# Patient Record
Sex: Female | Born: 2000 | Race: Black or African American | Hispanic: No | Marital: Single | Smoking: Former smoker
Health system: Southern US, Community
[De-identification: ages and names within clinical notes are randomized; demographics above are authoritative.]

## PROBLEM LIST (undated history)

## (undated) ENCOUNTER — Inpatient Hospital Stay (HOSPITAL_COMMUNITY): Payer: Self-pay

## (undated) HISTORY — PX: NO PAST SURGERIES: SHX2092

---
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
## 2022-09-11 ENCOUNTER — Ambulatory Visit: Payer: Medicaid Other

## 2023-02-14 NOTE — L&D Delivery Note (Signed)
 OB/GYN Faculty Practice Delivery Note  Norma Deleon is a 23 y.o. G1P0000 s/p SVD at [redacted]w[redacted]d. She was admitted for IOL due to gHTN.   ROM: 24h 91m with light meconium stained fluid GBS Status: Negative/-- (07/23 1148) Maximum Maternal Temperature: 40F   Labor Progress: Initial SVE: 0/50/ballotable. She then progressed to complete.   Delivery Date/Time: 10/01/23 1041 Delivery: Called to room and patient was complete and pushing. Head delivered ROA. No nuchal cord present. Shoulder and body delivered in usual fashion. Infant with spontaneous cry, placed on mother's abdomen, dried and stimulated. Cord clamped x 2 after 1-minute delay, and cut by father of baby, Damari. Cord blood drawn. Placenta delivered spontaneously with gentle cord traction. Fundus firm with massage and Pitocin . Labia, perineum, vagina, and cervix inspected inspected with extensive bilateral sulcal tears, a small second degree tear, and left labial laceration noted. These tears were all repaired in the usual fashion with adequate hemostasis. After repair, an in-and-out catheterization of the bladder was performed with 200cc drained. After repair, postpartum Pitocin , TXA, and straight catheterization, adequate hemostasis noted.  Baby Weight: pending  Placenta: Sent to L&D Complications: None Lacerations: Bilateral deep sulcal tears, second degree perineal laceration, left labial laceration -- all repaired EBL: 942 mL Analgesia: Epidural   Infant:  APGAR (1 MIN): 8  APGAR (5 MINS): 9   Alain Sor, MD OB Fellow, Faculty Practice [facility], Center for [facility]

## 2023-02-22 ENCOUNTER — Encounter: Payer: Self-pay | Admitting: Advanced Practice Midwife

## 2023-02-22 ENCOUNTER — Ambulatory Visit (INDEPENDENT_AMBULATORY_CARE_PROVIDER_SITE_OTHER): Payer: Medicaid Other | Admitting: Advanced Practice Midwife

## 2023-02-22 VITALS — BP 134/75 | HR 66 | Wt 161.4 lb

## 2023-02-22 DIAGNOSIS — Z3490 Encounter for supervision of normal pregnancy, unspecified, unspecified trimester: Secondary | ICD-10-CM

## 2023-02-22 DIAGNOSIS — N912 Amenorrhea, unspecified: Secondary | ICD-10-CM | POA: Diagnosis not present

## 2023-02-22 DIAGNOSIS — Z3201 Encounter for pregnancy test, result positive: Secondary | ICD-10-CM | POA: Diagnosis not present

## 2023-02-22 DIAGNOSIS — N926 Irregular menstruation, unspecified: Secondary | ICD-10-CM

## 2023-02-22 LAB — POCT URINE PREGNANCY: Preg Test, Ur: POSITIVE — AB

## 2023-02-22 NOTE — Progress Notes (Signed)
   Patient ID: Norma Deleon Minor, female   DOB: 2000-12-20, 23 y.o.   MRN: [id number]  Reason for Visit: pregnancy confirmation   Subjective:  HPI:  Norma Deleon is a 23 y.o. female being seen for pregnancy confirmation visit. She is 8 weeks 2 days by LMP of 12/26/22. She reports some nausea that has been improving. 2 positive pregnancy tests at home. Pregnancy not planned or desired, however, she plans to keep. She is informed of positive in office test today. Reviewed upcoming visits including labs/ultrasound.  History reviewed. No pertinent past medical history. Family History  Problem Relation Age of Onset   Healthy Paternal Grandfather    Healthy Paternal Grandmother    Healthy Maternal Grandmother    Diabetes Maternal Grandfather    Kidney disease Maternal Grandfather    Healthy Father    Diabetes Mother    Heart disease Mother    Past Surgical History:  Procedure Laterality Date   NO PAST SURGERIES      Short Social History:  Social History   Tobacco Use   Smoking status: Former    Types: E-cigarettes, Cigars   Smokeless tobacco: Never  Substance Use Topics   Alcohol use: Yes    Alcohol/week: 5.0 standard drinks of alcohol    Types: 5 Shots of liquor per week    Comment: occassionally    No Known Allergies  No current outpatient medications on file.   No current facility-administered medications for this visit.    Review of Systems  Constitutional:  Negative for chills and fever.  HENT:  Negative for congestion, ear discharge, ear pain, hearing loss, sinus pain and sore throat.   Eyes:  Negative for blurred vision and double vision.  Respiratory:  Negative for cough, shortness of breath and wheezing.   Cardiovascular:  Negative for chest pain, palpitations and leg swelling.  Gastrointestinal:  Positive for nausea. Negative for abdominal pain, blood in stool, constipation, diarrhea, heartburn, melena and vomiting.  Genitourinary:  Negative for dysuria, flank  pain, frequency, hematuria and urgency.  Musculoskeletal:  Negative for back pain, joint pain and myalgias.  Skin:  Negative for itching and rash.  Neurological:  Negative for dizziness, tingling, tremors, sensory change, speech change, focal weakness, seizures, loss of consciousness, weakness and headaches.  Endo/Heme/Allergies:  Negative for environmental allergies. Does not bruise/bleed easily.  Psychiatric/Behavioral:  Negative for depression, hallucinations, memory loss, substance abuse and suicidal ideas. The patient is not nervous/anxious and does not have insomnia.         Objective:  Objective   Vitals:   02/22/23 0918  BP: 134/75  Pulse: 66  Weight: 161 lb 6.4 oz (73.2 kg)   Body mass index is 27.7 kg/m. Constitutional: Well nourished, well developed female in no acute distress.  HEENT: normal Skin: Warm and dry.   Extremity:  no edema   Respiratory:  Normal respiratory effort Psych: Alert and Oriented x3. No memory deficits. Normal mood and affect.    Data:  Latest Reference Range & Units 02/22/23 09:46  Preg Test, Ur Negative  Positive !  !: Data is abnormal     Assessment/Plan:     23 year old with positive pregnancy test  Schedule NOB visits including ultrasound/labs   Slater Rains, CNM [facility] 02/22/2023 10:04 AM

## 2023-03-01 ENCOUNTER — Ambulatory Visit: Payer: Medicaid Other

## 2023-03-01 ENCOUNTER — Other Ambulatory Visit (HOSPITAL_COMMUNITY)
Admission: RE | Admit: 2023-03-01 | Discharge: 2023-03-01 | Disposition: A | Discharge status: Home / Self Care | Payer: Medicaid Other | Source: Ambulatory Visit | Attending: Obstetrics and Gynecology | Admitting: Obstetrics and Gynecology

## 2023-03-01 VITALS — BP 119/80 | HR 93 | Ht 65.0 in | Wt 161.3 lb

## 2023-03-01 DIAGNOSIS — Z113 Encounter for screening for infections with a predominantly sexual mode of transmission: Secondary | ICD-10-CM | POA: Insufficient documentation

## 2023-03-01 DIAGNOSIS — N898 Other specified noninflammatory disorders of vagina: Secondary | ICD-10-CM | POA: Diagnosis present

## 2023-03-01 NOTE — Progress Notes (Signed)
    NURSE VISIT NOTE  Subjective:    Patient ID: Norma Deleon, female    DOB: Jan 25, 2001, 23 y.o.   MRN: [id number]  HPI  Patient is a 23 y.o. G88P0000 female who presents for brown malodorous vaginal discharge for 2 week(s). Denies abnormal vaginal bleeding or significant pelvic pain or fever. denies UTI symptoms. Patient denies history of known exposure to STD.   Objective:    BP 119/80   Pulse 93   Ht 5\' 5"  (1.651 m)   Wt 161 lb 4.8 oz (73.2 kg)   LMP 12/26/2022   BMI 26.84 kg/m      Assessment:   1. Vaginal discharge   2. Screening for STD (sexually transmitted disease)   3. Vaginal odor       Plan:   GC and chlamydia DNA  probe sent to lab. Treatment: await results for further treatment ROV prn if symptoms persist or worsen.   Loman Chroman, CMA

## 2023-03-01 NOTE — Patient Instructions (Signed)
Bacterial Vaginosis  Bacterial vaginosis is an infection of the vagina. It happens when too many normal germs (healthy bacteria) grow in the vagina. This infection can make it easier to get other infections from sex (STIs). It is very important for pregnant women to get treated. This infection can cause babies to be born early or at a low birth weight. What are the causes? This infection is caused by an increase in certain germs that grow in the vagina. You cannot get this infection from toilet seats, bedsheets, swimming pools, or things that touch your vagina. What increases the risk? Having sex with a new person or more than one person. Having sex without protection. Douching. Having an intrauterine device (IUD). Smoking. Using drugs or drinking alcohol. These can lead you to do things that are risky. Taking certain antibiotic medicines. Being pregnant. What are the signs or symptoms? Some women have no symptoms. Symptoms may include: A discharge from your vagina. It may be gray or white. It can be watery or foamy. A fishy smell. This can happen after sex or during your menstrual period. Itching in and around your vagina. A feeling of burning or pain when you pee (urinate). How is this treated? This infection is treated with antibiotic medicines. These may be given to you as: A pill. A cream for your vagina. A medicine that you put into your vagina (suppository). If the infection comes back after treatment, you may need more antibiotics. Follow these instructions at home: Medicines Take over-the-counter and prescription medicines as told by your doctor. Take or use your antibiotic medicine as told by your doctor. Do not stop taking or using it, even if you start to feel better. General instructions If the person you have sex with is a woman, tell her that you have this infection. She will need to follow up with her doctor. If you have a female partner, he does not need to be  treated. Do not have sex until you finish treatment. Drink enough fluid to keep your pee pale yellow. Keep your vagina and butt clean. Wash the area with warm water each day. Wipe from front to back after you use the toilet. If you are breastfeeding a baby, ask your doctor if you should keep doing so during treatment. Keep all follow-up visits. How is this prevented? Self-care Do not douche. Use only warm water to wash around your vagina. Wear underwear that is Signer or lined with Agar. Do not wear tight pants and pantyhose, especially in the summer. Safe sex Use protection when you have sex. This includes: Use condoms. Use dental dams. This is a thin layer that protects the mouth during oral sex. Limit how many people you have sex with. To prevent this infection, it is best to have sex with just one person. Get tested for STIs. The person you have sex with should also get tested. Drugs and alcohol Do not smoke or use any products that contain nicotine or tobacco. If you need help quitting, ask your doctor. Do not use drugs. Do not drink alcohol if: Your doctor tells you not to drink. You are pregnant, may be pregnant, or are planning to become pregnant. If you drink alcohol: Limit how much you have to 0-1 drink a day. Know how much alcohol is in your drink. In the [place] one drink equals one 12 oz bottle of beer (355 mL), one 5 oz glass of wine (148 mL), or one 1 oz glass of hard liquor (44  mL). Where to find more information Centers for Disease Control and Prevention: FootballExhibition.com.br American Sexual Health Association: www.ashastd.org Office on [employer] Health: http://hoffman.com/ Contact a doctor if: Your symptoms do not get better, even after you are treated. You have more discharge or pain when you pee. You have a fever or chills. You have pain in your belly (abdomen) or in the area between your hips. You have pain with sex. You bleed from your vagina between menstrual  periods. Summary This infection can happen when too many germs (bacteria) grow in the vagina. This infection can make it easier to get infections from sex (STIs). Treating this can lower that chance. Get treated if you are pregnant. This infection can cause babies to be born early. Do not stop taking or using your antibiotic medicine, even if you start to feel better. This information is not intended to replace advice given to you by your health care provider. Make sure you discuss any questions you have with your health care provider. Document Revised: 07/31/2019 Document Reviewed: 07/31/2019 Elsevier Patient Education  2024 [employer] Vaginitis  Vaginitis is a condition in which the vaginal tissue swells and becomes irritated. This condition is most often caused by a change in the normal balance of bacteria and yeast that live in the vagina. This change causes an overgrowth of certain bacteria or yeast, which causes the inflammation. There are different types of vaginitis. What are the causes? The cause of this condition depends on the type of vaginitis. It can be caused by: Bacteria (bacterial vaginosis). Yeast, which is a fungus (candidiasis). A parasite (trichomoniasis vaginitis). A virus (viral vaginitis). Low hormone levels (atrophic vaginitis). Low hormone levels can occur during pregnancy, breastfeeding, or after menopause. Irritants, such as bubble baths, scented tampons, and feminine sprays (allergic vaginitis). Other factors can change the normal balance of the yeast and bacteria that live in the vagina. These include: Antibiotic medicines. Poor hygiene. Diaphragms, vaginal sponges, spermicides, birth control pills, and intrauterine devices (IUDs). Sex. Infection. Uncontrolled diabetes. A weakened body defense system (immune system). What increases the risk? This condition is more likely to develop in women who: Smoke or are exposed to secondhand smoke. Use vaginal  douches, scented tampons, or scented sanitary pads. Wear tight-fitting pants or thong underwear. Use oral birth control pills or an IUD. Have sex without a condom or have multiple partners. Have an STI. Frequently use the spermicide nonoxynol-9. Eat lots of foods high in sugar or who have uncontrolled diabetes. Have low estrogen levels. Have a weakened immune system from an immune disorder or medical treatment. Are pregnant or breastfeeding. What are the signs or symptoms? Symptoms vary depending on the cause of the vaginitis. Common symptoms include: Abnormal vaginal discharge. The discharge is white, gray, or yellow with bacterial vaginosis. The discharge is thick, white, and cheesy with a yeast infection. The discharge is frothy and yellow or greenish with trichomoniasis. A bad vaginal smell. The smell is fishy with bacterial vaginosis. Vaginal itching, pain, or swelling. Pain with sex. Pain or burning when urinating. Sometimes there are no symptoms. How is this diagnosed? This condition is diagnosed based on your symptoms and medical history. A physical exam, including a pelvic exam, will also be done. You may also have other tests, including: Tests to determine the pH level (acidity or alkalinity) of your vagina. A whiff test to assess the odor that results when a sample of your vaginal discharge is mixed with a potassium hydroxide solution. Tests of vaginal fluid.  A sample will be examined under a microscope. How is this treated? Treatment varies depending on the type of vaginitis you have. Your treatment may include: Antibiotic creams or pills to treat bacterial vaginosis and trichomoniasis. Antifungal medicines, such as vaginal creams or suppositories, to treat a yeast infection. Medicine to ease discomfort if you have viral vaginitis. Your sexual partner should also be treated. Estrogen delivered in a cream, pill, suppository, or vaginal ring to treat atrophic vaginitis. If  vaginal dryness occurs, lubricants and moisturizing creams may help. You may need to avoid scented soaps, sprays, or douches. Stopping use of a product that is causing allergic vaginitis and then using a vaginal cream to treat the symptoms. Follow these instructions at home: Lifestyle Keep your genital area clean and dry. Avoid soap, and only rinse the area with water. Do not douche or use tampons until your health care provider says it is okay. Use sanitary pads, if needed. Do not have sex until your health care provider approves. When you can return to sex, practice safe sex and use condoms. Wipe from front to back. This avoids the spread of bacteria from the rectum to the vagina. General instructions Take over-the-counter and prescription medicines only as told by your health care provider. If you were prescribed an antibiotic medicine, take or use it as told by your health care provider. Do not stop taking or using the antibiotic even if you start to feel better. Keep all follow-up visits. This is important. How is this prevented? Use mild, unscented products. Do not use things that can irritate the vagina, such as fabric softeners. Avoid the following products if they are scented: Feminine sprays. Detergents. Tampons. Feminine hygiene products. Soaps or bubble baths. Let air reach your genital area. To do this: Wear [place] underwear to reduce moisture buildup. Avoid wearing underwear while you sleep. Avoid wearing tight pants and underwear or nylons without a [place]le panel. Avoid wearing thong underwear. Take off any wet clothing, such as bathing suits, as soon as possible. Practice safe sex and use condoms. Contact a health care provider if: You have abdominal or pelvic pain. You have a fever or chills. You have symptoms that last for more than 2-3 days. Get help right away if: You have a fever and your symptoms suddenly get worse. Summary Vaginitis is a condition in which the  vaginal tissue becomes inflamed.This condition is most often caused by a change in the normal balance of bacteria and yeast that live in the vagina. Treatment varies depending on the type of vaginitis you have. Do not douche, use tampons, or have sex until your health care provider approves. When you can return to sex, practice safe sex and use condoms. This information is not intended to replace advice given to you by your health care provider. Make sure you discuss any questions you have with your health care provider. Document Revised: 07/31/2019 Document Reviewed: 07/31/2019 Elsevier Patient Education  2024 [employer].

## 2023-03-02 ENCOUNTER — Telehealth: Payer: Self-pay

## 2023-03-02 ENCOUNTER — Other Ambulatory Visit: Payer: Self-pay

## 2023-03-02 DIAGNOSIS — B9689 Other specified bacterial agents as the cause of diseases classified elsewhere: Secondary | ICD-10-CM

## 2023-03-02 LAB — CERVICOVAGINAL ANCILLARY ONLY
Bacterial Vaginitis (gardnerella): POSITIVE — AB
Candida Glabrata: NEGATIVE
Candida Vaginitis: NEGATIVE
Chlamydia: NEGATIVE
Comment: NEGATIVE
Comment: NEGATIVE
Comment: NEGATIVE
Comment: NEGATIVE
Comment: NEGATIVE
Comment: NORMAL
Neisseria Gonorrhea: NEGATIVE
Trichomonas: NEGATIVE

## 2023-03-02 MED ORDER — METRONIDAZOLE 500 MG PO TABS: 500.0000 mg | ORAL_TABLET | Freq: Two times a day (BID) | ORAL | 0 refills | Status: DC

## 2023-03-02 NOTE — Telephone Encounter (Signed)
Pt called triage, she got her results back and was positive for BV, Medication sent in to pharmacy. Pt aware

## 2023-03-05 ENCOUNTER — Telehealth: Payer: Medicaid Other

## 2023-03-05 DIAGNOSIS — Z3490 Encounter for supervision of normal pregnancy, unspecified, unspecified trimester: Secondary | ICD-10-CM | POA: Insufficient documentation

## 2023-03-05 DIAGNOSIS — Z3689 Encounter for other specified antenatal screening: Secondary | ICD-10-CM | POA: Diagnosis not present

## 2023-03-05 DIAGNOSIS — Z3401 Encounter for supervision of normal first pregnancy, first trimester: Secondary | ICD-10-CM | POA: Insufficient documentation

## 2023-03-05 NOTE — Patient Instructions (Signed)
First Trimester of Pregnancy  The first trimester of pregnancy starts on the first day of your last monthly period until the end of week 13. This is months 1 through 3 of pregnancy. A week after a sperm fertilizes an egg, the egg will implant into the wall of the uterus and begin to develop into a baby. Body changes during your first trimester Your body goes through many changes during pregnancy. The changes usually return to normal after your baby is born. Physical changes Your breasts may grow larger and may hurt. The area around your nipples may get darker. Your periods will stop. Your hair and nails may grow faster. You may pee more often. Health changes You may tire easily. Your gums may bleed and may be sensitive when you brush and floss. You may not feel hungry. You may have heartburn. You may throw up or feel like you may throw up. You may want to eat some foods, but not others. You may have headaches. You may have trouble pooping (constipation). Other changes Your emotions may change from day to day. You may have more dreams. Follow these instructions at home: Medicines Talk to your health care provider if you're taking medicines. Ask if the medicines are safe to take during pregnancy. Your provider may change the medicines that you take. Do not take any medicines unless told to by your provider. Take a prenatal vitamin that has at least 600 micrograms (mcg) of folic acid. Do not use herbal medicines, illegal substances, or medicines that are not approved by your provider. Eating and drinking While you're pregnant your body needs extra food for your growing baby. Talk with your provider about what to eat while pregnant. Activity Most women are able to exercise during pregnancy. Exercises may need to change as your pregnancy goes on. Talk to your provider about your activities and exercise routines. Relieving pain and discomfort Wear a good, supportive bra if your breasts  hurt. Rest with your legs raised if you have leg cramps or low back pain. Safety Wear your seatbelt at all times when you're in a car. Talk to your provider if someone hits you, hurts you, or yells at you. Talk with your provider if you're feeling sad or have thoughts of hurting yourself. Lifestyle Certain things can be harmful while you're pregnant. Follow these rules: Do not use hot tubs, steam rooms, or saunas. Do not douche. Do not use tampons or scented pads. Do not drink alcohol,smoke, vape, or use products with nicotine or tobacco in them. If you need help quitting, talk with your provider. Avoid cat litter boxes and soil used by cats. These things carry germs that can cause harm to your pregnancy and your baby. General instructions Keep all follow-up visits. It helps you and your unborn baby stay as healthy as possible. Write down your questions. Take them to your visits. Your provider will: Talk with you about your overall health. Give you advice or refer you to specialists who can help with different needs, including: Prenatal education classes. Mental health and counseling. Foods and healthy eating. Ask for help if you need help with food. Call your dentist and ask to be seen. Brush your teeth with a soft toothbrush. Floss gently. Where to find more information American Pregnancy Association: americanpregnancy.org [employer]: acog.org Office on [employer] Health: [employer] Contact a health care provider if: You feel dizzy, faint, or have a fever. You vomit or have watery poop (diarrhea) for 2  days or more. You have abnormal discharge or bleeding from your vagina. You have pain when you pee or your pee smells bad. You have cramps, pain, or pressure in your belly area. Get help right away if: You have trouble breathing or chest pain. You have any kind of injury, such as from a fall or a car crash. These symptoms may be an  emergency. Get help right away. Call 911. Do not wait to see if the symptoms will go away. Do not drive yourself to the hospital. This information is not intended to replace advice given to you by your health care provider. Make sure you discuss any questions you have with your health care provider. Document Revised: 11/02/2022 Document Reviewed: 06/02/2022 Elsevier Patient Education  2024 Elsevier Inc.  Morning Sickness Morning sickness is when you throw up or feel like you may throw up during pregnancy. This condition often occurs in the morning, but it can also occur at any time of day. Morning sickness is most common during the first three months of pregnancy, but it can go on throughout the pregnancy. Morning sickness is usually harmless. But if you throw up all the time, you should see your health care provider. You may also hear this condition called nausea and vomiting of pregnancy. What are the causes? The cause of morning sickness is not known. It may be linked to changes in hormones during pregnancy. What increases the risk? You're more likely to have morning sickness if: You had morning sickness in another pregnancy. You're pregnant with more than one baby, such as twins. You had morning sickness in other pregnancies. You have had motion sickness before you were pregnant. You have had bad headaches or migraines before you were pregnant. What are the signs or symptoms? Symptoms of morning sickness include: Feeling like you may throw up. Throwing up. How is this diagnosed? Morning sickness is diagnosed based on your symptoms. How is this treated? Treatment is usually not needed for morning sickness. You may only need to change what you eat. In some cases, your provider may give you: Vitamin B6 supplements. Medicines to prevent throwing up. Ginger. Follow these instructions at home: Medicines Take your medicines only as told by your provider. Do not use any prescription,  over-the-counter, or herbal medicines for morning sickness without first talking with your provider. Take prenatal vitamins. These can stop or lessen the symptoms of morning sickness. If you feel like you may throw up after taking prenatal vitamins, take them at night or with a snack. Eating and drinking     Eat dry toast or crackers before getting out of bed. Eat 5 or 6 small meals a day. Try ginger ale made with real ginger, ginger tea, or ginger candies. Drink fluids throughout the day. Eat protein foods when you need a snack. Nuts, yogurt, and cheese are good choices. Eat dry and bland foods like rice or baked potatoes. Foods that are high in carbohydrates are often helpful. Have someone cook for you if the smell of food makes you want to throw up. Foods to avoid Greasy foods. Fatty foods. Spicy foods. General instructions Try to avoid smells that make you feel sick. Use an air purifier to keep the air in your house free of smells. Try using an acupressure wristband. This is a wristband that's used to treat motion sickness. Try acupuncture. In this treatment, a provider puts thin needles into certain areas of your body to make you feel better. Brush your teeth  after throwing up or rinse with a mix of baking soda and water. The acid in throw-up can hurt your teeth. Contact a health care provider if: Your symptoms do not get better. You feel dizzy or light-headed. You're losing weight. Get help right away if: The feeling that you may throw up will not go away, or you can't stop throwing up. You faint. You have very bad pain in your belly. This information is not intended to replace advice given to you by your health care provider. Make sure you discuss any questions you have with your health care provider. Document Revised: 11/02/2022 Document Reviewed: 05/11/2022 Elsevier Patient Education  2024 Elsevier Inc.  Commonly Asked Questions During Pregnancy  Cats: A parasite can  be excreted in cat feces.  To avoid exposure you need to have another person empty the little box.  If you must empty the litter box you will need to wear gloves.  Wash your hands after handling your cat.  This parasite can also be found in raw or undercooked meat so this should also be avoided.  Colds, Sore Throats, Flu: Please check your medication sheet to see what you can take for symptoms.  If your symptoms are unrelieved by these medications please call the office.  Dental Work: Most any dental work Agricultural consultant recommends is permitted.  X-rays should only be taken during the first trimester if absolutely necessary.  Your abdomen should be shielded with a lead apron during all x-rays.  Please notify your provider prior to receiving any x-rays.  Novocaine is fine; gas is not recommended.  If your dentist requires a note from [place] prior to dental work please call the office and we will provide one for you.  Exercise: Exercise is an important part of staying healthy during your pregnancy.  You may continue most exercises you were accustomed to prior to pregnancy.  Later in your pregnancy you will most likely notice you have difficulty with activities requiring balance like riding a bicycle.  It is important that you listen to your body and avoid activities that put you at a higher risk of falling.  Adequate rest and staying well hydrated are a must!  If you have questions about the safety of specific activities ask your provider.    Exposure to Children with illness: Try to avoid obvious exposure; report any symptoms to [place] when noted,  If you have chicken pos, red measles or mumps, you should be immune to these diseases.   Please do not take any vaccines while pregnant unless you have checked with your OB provider.  Fetal Movement: After 28 weeks we recommend you do "kick counts" twice daily.  Lie or sit down in a calm quiet environment and count your baby movements "kicks".  You should feel your baby at  least 10 times per hour.  If you have not felt 10 kicks within the first hour get up, walk around and have something sweet to eat or drink then repeat for an additional hour.  If count remains less than 10 per hour notify your provider.  Fumigating: Follow your pest control agent's advice as to how long to stay out of your home.  Ventilate the area well before re-entering.  Hemorrhoids:   Most over-the-counter preparations can be used during pregnancy.  Check your medication to see what is safe to use.  It is important to use a stool softener or fiber in your diet and to drink lots of liquids.  If  hemorrhoids seem to be getting worse please call the office.   Hot Tubs:  Hot tubs Jacuzzis and saunas are not recommended while pregnant.  These increase your internal body temperature and should be avoided.  Intercourse:  Sexual intercourse is safe during pregnancy as long as you are comfortable, unless otherwise advised by your provider.  Spotting may occur after intercourse; report any bright red bleeding that is heavier than spotting.  Labor:  If you know that you are in labor, please go to the hospital.  If you are unsure, please call the office and let us help you decide what to do.  Lifting, straining, etc:  If your job requires heavy lifting or straining please check with your provider for any limitations.  Generally, you should not lift items heavier than that you can lift simply with your hands and arms (no back muscles)  Painting:  Paint fumes do not harm your pregnancy, but may make you ill and should be avoided if possible.  Latex or water based paints have less odor than oils.  Use adequate ventilation while painting.  Permanents & Hair Color:  Chemicals in hair dyes are not recommended as they cause increase hair dryness which can increase hair loss during pregnancy.  " Highlighting" and permanents are allowed.  Dye may be absorbed differently and permanents may not hold as well during  pregnancy.  Sunbathing:  Use a sunscreen, as skin burns easily during pregnancy.  Drink plenty of fluids; avoid over heating.  Tanning Beds:  Because their possible side effects are still unknown, tanning beds are not recommended.  Ultrasound Scans:  Routine ultrasounds are performed at approximately 20 weeks.  You will be able to see your baby's general anatomy an if you would like to know the gender this can usually be determined as well.  If it is questionable when you conceived you may also receive an ultrasound early in your pregnancy for dating purposes.  Otherwise ultrasound exams are not routinely performed unless there is a medical necessity.  Although you can request a scan we ask that you pay for it when conducted because insurance does not cover " patient request" scans.  Work: If your pregnancy proceeds without complications you may work until your due date, unless your physician or employer advises otherwise.  Round Ligament Pain/Pelvic Discomfort:  Sharp, shooting pains not associated with bleeding are fairly common, usually occurring in the second trimester of pregnancy.  They tend to be worse when standing up or when you remain standing for long periods of time.  These are the result of pressure of certain pelvic ligaments called "round ligaments".  Rest, Tylenol and heat seem to be the most effective relief.  As the womb and fetus grow, they rise out of the pelvis and the discomfort improves.  Please notify the office if your pain seems different than that described.  It may represent a more serious condition.   Common Medications Safe in Pregnancy  Acne:      Constipation:  Benzoyl Peroxide     Colace  Clindamycin      Dulcolax Suppository  Topica Erythromycin     Fibercon  Salicylic Acid      Metamucil         Miralax AVOID:        Senakot   Accutane    Cough:  Retin-A       Cough Drops  Tetracycline      Phenergan w/ Codeine if Rx  Minocycline  Robitussin (Plain &  DM)  Antibiotics:     Crabs/Lice:  Ceclor       RID  Cephalosporins    AVOID:  E-Mycins      Kwell  Keflex  Macrobid/Macrodantin   Diarrhea:  Penicillin      Kao-Pectate  Zithromax      Imodium AD         PUSH FLUIDS AVOID:       Cipro     Fever:  Tetracycline      Tylenol (Regular or Extra  Minocycline       Strength)  Levaquin      Extra Strength-Do not          Exceed 8 tabs/24 hrs Caffeine:        200mg /day (equiv. To 1 cup of coffee or  approx. 3 12 oz sodas)         Gas: Cold/Hayfever:       Gas-X  Benadryl      Mylicon  Claritin       Phazyme  **Claritin-D        Chlor-Trimeton    Headaches:  Dimetapp      ASA-Free Excedrin  Drixoral-Non-Drowsy     Cold Compress  Mucinex (Guaifenasin)     Tylenol (Regular or Extra  Sudafed/Sudafed-12 Hour     Strength)  **Sudafed PE Pseudoephedrine   Tylenol Cold & Sinus     Vicks Vapor Rub  Zyrtec  **AVOID if Problems With Blood Pressure         Heartburn: Avoid lying down for at least 1 hour after meals  Aciphex      Maalox     Rash:  Milk of Magnesia     Benadryl    Mylanta       1% Hydrocortisone Cream  Pepcid  Pepcid Complete   Sleep Aids:  Prevacid      Ambien   Prilosec       Benadryl  Rolaids       Chamomile Tea  Tums (Limit 4/day)     Unisom         Tylenol PM         Warm milk-add vanilla or  Hemorrhoids:       Sugar for taste  Anusol/Anusol H.C.  (RX: Analapram 2.5%)  Sugar Substitutes:  Hydrocortisone OTC     Ok in moderation  Preparation H      Tucks        Vaseline lotion applied to tissue with wiping    Herpes:     Throat:  Acyclovir      Oragel  Famvir  Valtrex     Vaccines:         Flu Shot Leg Cramps:       *Gardasil  Benadryl      Hepatitis A         Hepatitis B Nasal Spray:       Pneumovax  Saline Nasal Spray     Polio Booster         Tetanus Nausea:       Tuberculosis test or PPD  Vitamin B6 25 mg TID   AVOID:    Dramamine      *Gardasil  Emetrol       Live Poliovirus  Ginger  Root 250 mg QID    MMR (measles, mumps &  High Complex Carbs @ Bedtime    rebella)  Sea Bands-Accupressure    Varicella (Chickenpox)  Unisom 1/2  tab TID     *No known complications           If received before Pain:         Known pregnancy;   Darvocet       Resume series after  Lortab        Delivery  Percocet    Yeast:   Tramadol      Femstat  Tylenol 3      Gyne-lotrimin  Ultram       Monistat  Vicodin           MISC:         All Sunscreens           Hair Coloring/highlights          Insect Repellant's          (Including DEET)         Mystic Tans

## 2023-03-05 NOTE — Progress Notes (Cosign Needed Addendum)
New OB Intake  I connected with  Norma Deleon on 03/05/23 at 11:15 AM EST by telephone Video Visit and verified that I am speaking with the correct person using two identifiers. Nurse is located at [facility] and pt is located at home.  I discussed the limitations, risks, security and privacy concerns of performing an evaluation and management service by telephone and the availability of in person appointments. I also discussed with the patient that there may be a patient responsible charge related to this service. The patient expressed understanding and agreed to proceed.  I explained I am completing New OB Intake today. We discussed her EDD of 10/02/2023 that is based on LMP of 12/26/2022. Pt is G1/P0000. I reviewed her allergies, medications, Medical/Surgical/OB history, and appropriate screenings. There are no cats in the home. Based on history, this is a/an pregnancy uncomplicated . Her obstetrical history is significant for obesity.  Patient Active Problem List   Diagnosis Date Noted   Overweight BMI=28.7 04/19/2021    Concerns addressed today  Delivery Plans:  Plans to deliver at [facility]  Anatomy US Explained second scheduled [place] will be around 19 - 20 weeks.   Labs Discussed Avelina Laine genetic screening with patient. Patient desires genetic testing to be drawn at new OB visit. Discussed possible labs to be drawn at new OB appointment.  COVID Vaccine Patient has not had COVID vaccine.  Patient desires flu vaccine. Patient desires TDAP vaccine. Patient desires RSV vaccine.  Social Determinants of Health Food Insecurity: denies food insecurity WIC Referral: Patient is interested in referral to [facility].  Transportation: Patient denies transportation needs. Childcare: Discussed no children allowed at ultrasound appointments.   First visit review I reviewed new OB appt with pt. I explained she will have ob bloodwork and she is not age appropriate for a  pap  smear, pelvic exam if indicated. Explained pt will be seen by [facility]; at first visit; encounter routed to appropriate provider.   Tommie Raymond, CMA 03/05/2023  11:26 AM

## 2023-03-16 ENCOUNTER — Ambulatory Visit: Payer: Medicaid Other

## 2023-03-16 DIAGNOSIS — Z3491 Encounter for supervision of normal pregnancy, unspecified, first trimester: Secondary | ICD-10-CM

## 2023-03-16 DIAGNOSIS — Z3490 Encounter for supervision of normal pregnancy, unspecified, unspecified trimester: Secondary | ICD-10-CM

## 2023-03-16 DIAGNOSIS — Z3A11 11 weeks gestation of pregnancy: Secondary | ICD-10-CM

## 2023-03-21 ENCOUNTER — Encounter: Payer: Self-pay | Admitting: Advanced Practice Midwife

## 2023-03-21 ENCOUNTER — Other Ambulatory Visit (HOSPITAL_COMMUNITY)
Admission: RE | Admit: 2023-03-21 | Discharge: 2023-03-21 | Disposition: A | Discharge status: Home / Self Care | Payer: Medicaid Other | Source: Ambulatory Visit | Attending: Advanced Practice Midwife | Admitting: Advanced Practice Midwife

## 2023-03-21 ENCOUNTER — Ambulatory Visit (INDEPENDENT_AMBULATORY_CARE_PROVIDER_SITE_OTHER): Payer: Medicaid Other | Admitting: Advanced Practice Midwife

## 2023-03-21 VITALS — BP 128/83 | HR 89 | Wt 157.3 lb

## 2023-03-21 DIAGNOSIS — Z3A12 12 weeks gestation of pregnancy: Secondary | ICD-10-CM

## 2023-03-21 DIAGNOSIS — Z113 Encounter for screening for infections with a predominantly sexual mode of transmission: Secondary | ICD-10-CM

## 2023-03-21 DIAGNOSIS — Z3401 Encounter for supervision of normal first pregnancy, first trimester: Secondary | ICD-10-CM | POA: Diagnosis not present

## 2023-03-21 LAB — POCT URINALYSIS DIPSTICK
Bilirubin, UA: NEGATIVE
Blood, UA: NEGATIVE
Glucose, UA: NEGATIVE
Ketones, UA: NEGATIVE
Leukocytes, UA: NEGATIVE
Nitrite, UA: NEGATIVE
Protein, UA: NEGATIVE
Spec Grav, UA: 1.015 (ref 1.010–1.025)
Urobilinogen, UA: 0.2 U/dL
pH, UA: 7 (ref 5.0–8.0)

## 2023-03-21 NOTE — Progress Notes (Addendum)
 [facility]  New Obstetric Patient H&P    Chief Complaint: Desires prenatal care   History of Present Illness: Patient is a 23 y.o. G1P0000 Not Hispanic or Latino female, presents with amenorrhea and positive home pregnancy test. Patient's last menstrual period was 12/26/2022 (exact date). and based on her  LMP, her EDD is Estimated Date of Delivery: 10/02/23 and her EGA is [redacted]w[redacted]d. Cycles are 5-7 days, regular, and occur approximately every : 28 days.    She had a urine pregnancy test which was positive 7 week(s)  ago. Her last menstrual period was normal and lasted for  5 or 6 day(s). Since her LMP she claims she has experienced breast tenderness, fatigue, nausea, vomiting. She denies vaginal bleeding. Her past medical history is noncontributory.   Since her LMP, she admits to the use of tobacco products  no She claims she has lost  3  pounds since the start of her pregnancy.  There are cats in the home in the home  no  She admits close contact with children on a regular basis:  yes, she has childcare job  She has had chicken pox in the past no She has had Tuberculosis exposures, symptoms, or previously tested positive for TB   no Current or past history of domestic violence. no  Genetic Screening/Teratology Counseling: (Includes patient, baby's father, or anyone in either family with:)   1. Patient's age >/= 33 at [facility]  no 2. Thalassemia (Italian, Greek, Mediterranean, or Asian background): MCV<80  no 3. Neural tube defect (meningomyelocele, spina bifida, anencephaly)  no 4. Congenital heart defect  no  5. Down syndrome  no 6. Tay-Sachs (Jewish, French Canadian)  no 7. Canavan's Disease  no 8. Sickle cell disease or trait (African)  no  9. Hemophilia or other blood disorders  no  10. Muscular dystrophy  no  11. Cystic fibrosis  no  12. Huntington's Chorea  no  13. Mental retardation/autism  no 14. Other inherited genetic or chromosomal disorder  no 15. Maternal metabolic  disorder (DM, PKU, etc)  no 16. Patient or FOB with a child with a birth defect not listed above no  16a. Patient or FOB with a birth defect themselves no 17. Recurrent pregnancy loss, or stillbirth  no  18. Any medications since LMP other than prenatal vitamins (include vitamins, supplements, OTC meds, drugs, alcohol)  use of Marijuana- quit with +UPT 19. Any other genetic/environmental exposure to discuss  no  Infection History:   1. Lives with someone with TB or TB exposed  no  2. Patient or partner has history of genital herpes  no 3. Rash or viral illness since LMP  no 4. History of STI (GC, CT, HPV, syphilis, HIV)  no 5. History of recent travel :  no  Other pertinent information:  no     Review of Systems:10 point review of systems negative unless otherwise noted in HPI  Past Medical History:  Patient Active Problem List   Diagnosis Date Noted Date Diagnosed   Supervision of normal pregnancy 03/05/2023      Clinical Staff Provider  Office Location   Ob/Gyn Dating  Not found.  Language  English Anatomy US     Flu Vaccine  offer Genetic Screen  NIPS:   TDaP vaccine  offer Hgb A1C or  GTT Early : Third trimester :   Covid    LAB RESULTS   Rhogam     Blood Type     RSV offer Antibody  Feeding Plan breast Rubella    Contraception Condoms RPR     Circumcision If applicable HBsAg     Pediatrician   HIV    Support Person Damari Mims Varicella    Prenatal Classes yes GBS  (For PCN allergy, check sensitivities)     Hep C     BTL Consent  Pap No results found for: DIAGPAP  VBAC Consent  Hgb Electro      CF      SMA             Overweight BMI=28.7 04/19/2021     Past Surgical History:  Past Surgical History:  Procedure Laterality Date   NO PAST SURGERIES      Gynecologic History: Patient's last menstrual period was 12/26/2022 (exact date).  Obstetric History: G1P0000  Family History:  Family History  Problem Relation Age of Onset   Diabetes  Mother    Heart disease Mother    Healthy Father    Healthy Maternal Grandmother    Heart failure Maternal Grandfather    Diabetes Maternal Grandfather    Kidney disease Maternal Grandfather    Healthy Paternal Grandmother    Healthy Paternal Grandfather     Social History:  Social History   Socioeconomic History   Marital status: Single    Spouse name: Not on file   Number of children: 0   Years of education: 11   Highest education level: High school graduate  Occupational History   Not on file  Tobacco Use   Smoking status: Former    Types: E-cigarettes, Cigars    Passive exposure: Past   Smokeless tobacco: Never  Vaping Use   Vaping status: Never Used  Substance and Sexual Activity   Alcohol use: Not Currently    Alcohol/week: 5.0 standard drinks of alcohol    Types: 5 Shots of liquor per week    Comment: occassionally   Drug use: Not Currently    Types: Marijuana    Comment: Currently using MJ as of 09/21/21   Sexual activity: Yes    Partners: Male    Birth control/protection: Condom  Other Topics Concern   Not on file  Social History Narrative   Not on file   Social Drivers of Health   Financial Resource Strain: Not on file  Food Insecurity: No Food Insecurity (03/05/2023)   Hunger Vital Sign    Worried About Running Out of Food in the Last Year: Never true    Ran Out of Food in the Last Year: Never true  Transportation Needs: No Transportation Needs (03/05/2023)   PRAPARE - Administrator, Civil Service (Medical): No    Lack of Transportation (Non-Medical): No  Physical Activity: Not on file  Stress: No Stress Concern Present (03/05/2023)   [employer] of Occupational Health - Occupational Stress Questionnaire    Feeling of Stress : Not at all  Social Connections: Not on file  Intimate Partner Violence: Not At Risk (03/05/2023)   Humiliation, Afraid, Rape, and Kick questionnaire    Fear of Current or Ex-Partner: No    Emotionally  Abused: No    Physically Abused: No    Sexually Abused: No    Allergies:  No Known Allergies  Medications: Prior to Admission medications   Medication Sig Start Date End Date Taking? Authorizing Provider  Prenatal Vit-Fe Fumarate-FA (PRENATAL VITAMINS) 27-0.8 MG TABS Take 1 capsule by mouth daily.   Yes [provider]    Physical Exam Vitals:  Blood pressure 128/83, pulse 89, weight 157 lb 4.8 oz (71.4 kg), last menstrual period 12/26/2022.  General: NAD HEENT: normocephalic, anicteric Thyroid: no enlargement, no palpable nodules Pulmonary: No increased work of breathing, CTAB Cardiovascular: RRR, distal pulses 2+ Abdomen: NABS, soft, non-tender, non-distended.  Umbilicus without lesions.  No evidence of hernia. FHTs 160s by doppler.  Genitourinary: deferred for PAP interval/self swab aptima Extremities: no edema, erythema, or tenderness Neurologic: Grossly intact Psychiatric: mood appropriate, affect full   The following were addressed during this visit:  Breastfeeding Education - Early initiation of breastfeeding    Comments: Keeps milk supply adequate, helps contract uterus and slow bleeding, and early milk is the perfect first food and is easy to digest.   - The importance of exclusive breastfeeding    Comments: Provides antibodies, Lower risk of breast and ovarian cancers, and type-2 diabetes,Helps your body recover, Reduced chance of SIDS.   - Risks of giving your baby anything other than breast milk if you are breastfeeding    Comments: Make the baby less content with breastfeeds, may make my baby more susceptible to illness, and may reduce my milk supply.   - The importance of early skin-to-skin contact    Comments:  Keeps baby warm and secure, helps keep baby's blood sugar up and breathing steady, easier to bond and breastfeed, and helps calm baby.  - Rooming-in on a 24-hour basis    Comments: Easier to learn baby's feeding cues, easier to bond and  get to know each other, and encourages milk production.   - Feeding on demand or baby-led feeding    Comments: Helps prevent breastfeeding complications, helps bring in good milk supply, prevents under or overfeeding, and helps baby feel content and satisfied   - Frequent feeding to help assure optimal milk production    Comments: Making a full supply of milk requires frequent removal of milk from breasts, infant will eat 8-12 times in 24 hours, if separated from infant use breast massage, hand expression and/ or pumping to remove milk from breasts.   - Effective positioning and attachment    Comments: Helps my baby to get enough breast milk, helps to produce an adequate milk supply, and helps prevent nipple pain and damage   - Exclusive breastfeeding for the first 6 months    Comments: Builds a healthy milk supply and keeps it up, protects baby from sickness and disease, and breastmilk has everything your baby needs for the first 6 months.    Assessment: 23 y.o. G1P0000 at [redacted]w[redacted]d presenting to initiate prenatal care  Plan: 1) Avoid alcoholic beverages. 2) Patient encouraged not to smoke.  3) Discontinue the use of all non-medicinal drugs and chemicals.  4) Take prenatal vitamins daily.  5) Nutrition, food safety (fish, cheese advisories, and high nitrite foods) and exercise discussed. 6) Hospital and practice style discussed with cross coverage system.  7) Genetic Screening, such as with 1st Trimester Screening, cell free fetal DNA, AFP testing, and Ultrasound, as well as with amniocentesis and CVS as appropriate, is discussed with patient. At the conclusion of today's visit patient requested genetic testing 8) Patient is asked about travel to areas at risk for the Zika virus, and counseled to avoid travel and exposure to mosquitoes or sexual partners who may have themselves been exposed to the virus. Testing is discussed, and will be ordered as appropriate. 9) NOB labs today  10)  Return to clinic for ROB in 4 weeks   Slater Rains, CNM [place]  [facility] 03/21/2023 11:58 AM

## 2023-03-21 NOTE — Patient Instructions (Signed)
 Exercise During Pregnancy Exercise is an important part of being healthy for people of all ages. Exercise helps your heart and lungs work well. Exercise also: Helps you stay strong and flexible. Helps you keep a healthy body weight. Boosts your energy levels and improves your mood. You should try to exercise regularly during pregnancy. Exercise routines may need to change later in your pregnancy. In rare cases, certain medical problems in your pregnancy may limit the exercise you can do during pregnancy. Your health care provider will give you information on what exercises will work for you. How does exercise help during pregnancy? Along with staying strong and flexible, exercising during pregnancy can help: Keep strength in muscles that are used during labor and birth. Control weight gain. Speed up your recovery after giving birth. Reduce the need for insulin if you get diabetes during pregnancy. Decrease low back pain. Lower the risk for depression. Lower the risk of cesarean delivery. Treat trouble pooping (constipation). How does exercise affect my baby? Exercise can help you have a healthy pregnancy. Exercise does not cause your baby to be born early. It will not cause your baby to weigh less at birth. What exercises can I do? Many exercises are safe for you to do during pregnancy. Do a variety of exercises that safely increase your heart and breathing rates and help you build and maintain muscle strength. Do exercises as told by your provider. Your provider may recommend: Walking. Swimming. Water aerobics. Riding a stationary bike. Modified yoga or Pilates. Tell your instructor that you're pregnant. Avoid overstretching. Avoid lying on your back for Leys periods of time. Resistance exercises with weights or elastic bands. Running or jogging. Choose this type of exercise only if: You ran or jogged regularly before your pregnancy. You can run or jog and still talk in full  sentences. What exercises should I avoid? You may be told to limit high-intensity exercise depending on your level of fitness and if you exercised regularly before you became pregnant. You can tell that you're exercising at a high intensity if you're breathing much harder and faster and can't hold a conversation while exercising. You may be told to: Avoid jogging or running, unless you jogged or ran regularly before you became pregnant. Do not run or jog so fast that you're unable to have a conversation. Avoid activities that put you at risk for falling on your belly or getting hit in the belly. Some of these are: Downhill skiing. Rock climbing. Cycling and gymnastics. Horseback riding. Surfing and waterskiing. Contact sports. Avoid scuba diving. Avoid skydiving. Avoid activities that take place in a room that's heated to high temperatures, such as hot yoga or hot Pilates. How do I exercise in a safe way?  Start slowly. Ask your provider to recommend the types of exercise that are safe for you. Avoid overheating. Do not exercise in very high temperatures or hot rooms. Avoid hot yoga or hot Pilates. Avoid standing still or lying flat on your back as much as you can. Avoid losing too much fluid (dehydration). Drink more fluids as told. Drink before, during, and after you exercise. Avoid overstretching. Because of hormone changes during pregnancy, it's easy to overstretch muscles, tendons, and ligaments. Ligaments are the tissues that connect bones to each other. Do not exercise to lose weight. Do not exercise at more than 6,000 feet above sea level (high elevation) if you don't live at that elevation. Tips and recommendations Wear loose-fitting, breathable clothes. Wear a sports bra to  support your breasts. Exercise on most days or all days of the week. Try to exercise for 30 minutes a day, 5 days a week. If problems come up during your pregnancy, you provider may tell you to limit some  exercises or to exercise less. If you have concerns, ask your provider. If you actively exercised before your pregnancy, your provider may tell you to continue to do moderate-intensity to high-intensity exercise. If you're just starting to exercise or didn't exercise much before your pregnancy, your provider may tell you to do low-intensity to moderate-intensity exercise. Questions to ask your health care provider Is exercise safe for me? What are signs that I should stop exercising? Does my health condition mean that I should not exercise during pregnancy? When should I avoid exercising during pregnancy? Stop exercising and contact a health care provider if: You have any unusual symptoms, such as: Mild contractions or cramps in the belly. Dizziness that does not go away when you rest. Headache. Pain and swelling of your calves. Bleeding or fluid leaking from your vagina. Stop exercising and get help right away if: You have: Chest pain. Shortness of breath. Sudden, severe pain in your low back or your belly. Regular, painful contractions before 37 weeks of pregnancy. These symptoms may be an emergency. Call 911 right away. Do not wait to see if the symptoms will go away. Do not drive yourself to the hospital. This information is not intended to replace advice given to you by your health care provider. Make sure you discuss any questions you have with your health care provider. Document Revised: 09/25/2022 Document Reviewed: 09/25/2022 Elsevier Patient Education  2024 Elsevier Inc.Eating Plan for Pregnant Women While you are pregnant, your body requires additional nutrition to help support your growing baby. You also have a higher need for some vitamins and minerals, such as folic acid, calcium, iron, and vitamin D. Eating a healthy, well-balanced diet is very important for your health and your baby's health. Your need for extra calories varies over the course of your pregnancy. Pregnancy  is divided into three trimesters, with each trimester lasting 3 months. For most women, it is recommended to consume: 150 extra calories a day during the first trimester. 300 extra calories a day during the second trimester. 300 extra calories a day during the third trimester. What are tips for following this plan? Cooking Practice good food safety and cleanliness. Wash your hands before you eat and after you prepare raw meat. Wash all fruits and vegetables well before peeling or eating. Taking these actions can help to prevent foodborne illnesses that can be very dangerous to your baby, such as listeriosis. Ask your health care provider for more information about listeriosis. Make sure that all meats, poultry, and eggs are cooked to food-safe temperatures or "well-done." Meal planning  Eat a variety of foods (especially fruits and vegetables) to get a full range of vitamins and minerals. Two or more servings of fish are recommended each week in order to get the most benefits from omega-3 fatty acids that are found in seafood. Choose fish that are lower in mercury, such as salmon and pollock. Limit your overall intake of foods that have "empty calories." These are foods that have little nutritional value, such as sweets, desserts, candies, and sugar-sweetened beverages. Drinks that contain caffeine are okay to drink, but it is better to avoid caffeine. Keep your total caffeine intake to less than 200 mg each day (which is 12 oz or 355 mL of  coffee, tea, or soda) or the limit as told by your health care provider. General information Do not try to lose weight or go on a diet during pregnancy. Take a prenatal vitamin to help meet your additional vitamin and mineral needs during pregnancy, specifically for folic acid, iron, calcium, and vitamin D. Remember to stay active. Ask your health care provider what types of exercise and activities are safe for you. What does 150 extra calories look  like? Healthy options that provide 150 extra calories each day could be any of the following: 6-8 oz (170-227 g) plain low-fat yogurt with  cup (70 g) berries. 1 apple with 2 tsp (11 g) peanut butter. Cut-up vegetables with  cup (60 g) hummus. 8 fl oz (237 mL) low-fat chocolate milk. 1 stick of string cheese with 1 medium orange. 1 peanut butter and jelly sandwich that is made with one slice of whole-wheat bread and 1 tsp (5 g) of peanut butter. For 300 extra calories, you could eat two of these healthy options each day. What is a healthy amount of weight to gain? The right amount of weight gain for you is based on your BMI (body mass index) before you became pregnant. If your BMI was less than 18 (underweight), you should gain 28-40 lb (13-18 kg). If your BMI was 18-24.9 (normal), you should gain 25-35 lb (11-16 kg). If your BMI was 25-29.9 (overweight), you should gain 15-25 lb (7-11 kg). If your BMI was 30 or greater (obese), you should gain 11-20 lb (5-9 kg). What if I am having twins or multiples? Generally, if you are carrying twins or multiples: You may need to eat 300-600 extra calories a day. The recommended range for total weight gain is 25-54 lb (11-25 kg), depending on your BMI before pregnancy. Talk with your health care provider to find out about nutritional needs, weight gain, and exercise that is right for you. What foods should I eat?  Fruits All fruits. Eat a variety of colors and types of fruit. Remember to wash your fruits well before peeling or eating. Vegetables All vegetables. Eat a variety of colors and types of vegetables. Remember to wash your vegetables well before peeling or eating. Grains All grains. Choose whole grains, such as whole-wheat bread, oatmeal, or brown rice. Meats and other protein foods Lean meats, including chicken, [place], and lean cuts of beef, veal, or pork. Fish that is higher in omega-3 fatty acids and lower in mercury, such as salmon,  herring, mussels, trout, sardines, pollock, shrimp, crab, and lobster. Tofu. Tempeh. Beans. Eggs. Peanut butter and other nut butters. Dairy Pasteurized milk and milk alternatives, such as almond milk. Pasteurized yogurt and pasteurized cheese. Cottage cheese. Sour cream. Beverages Water. Juices that contain 100% fruit juice or vegetable juice. Caffeine-free teas and decaffeinated coffee. Fats and oils Fats and oils are okay to include in moderation. Sweets and desserts Sweets and desserts are okay to include in moderation. Seasoning and other foods All pasteurized condiments. The items listed above may not be a complete list of foods and beverages you can eat. Contact a dietitian for more information. What foods should I avoid? Fruits Raw (unpasteurized) fruit juices. Vegetables Unpasteurized vegetable juices. Meats and other protein foods Precooked or cured meat, such as bologna, hot dogs, sausages, or meat loaves. (If you must eat those meats, reheat them until they are steaming hot.) Refrigerated pate, meat spreads from a meat counter, or smoked seafood that is found in the refrigerated section of a  store. Raw or undercooked meats, poultry, and eggs. Raw fish, such as sushi or sashimi. Fish that have high mercury content, such as tilefish, shark, swordfish, and king mackerel. Dairy Unpasteurized milk and any foods that have unpasteurized milk in them. Soft cheeses, such as feta, queso blanco, queso fresco, No Name, Cesar Chavez, panela, and blue-veined cheeses (unless they are made with pasteurized milk, which must be stated on the label). Beverages Alcohol. Sugar-sweetened beverages, such as sodas, teas, or energy drinks. Seasoning and other foods Homemade fermented foods and drinks, such as pickles, sauerkraut, or kombucha drinks. (Store-bought pasteurized versions of these are okay.) Salads that are made in a store or deli, such as ham salad, chicken salad, egg salad, tuna salad, and  seafood salad. The items listed above may not be a complete list of foods and beverages you should avoid. Contact a dietitian for more information. Where to find more information To calculate the number of calories you need based on your height, weight, and activity level, you can use an online calculator such as: PayStrike.dk To calculate how much weight you should gain during pregnancy, you can use an online pregnancy weight gain calculator such as: http://www.harvey.com/ To learn more about eating fish during pregnancy, talk with your health care provider or visit: PumpkinSearch.com.ee Summary While you are pregnant, your body requires additional nutrition to help support your growing baby. Eat a variety of foods, especially fruits and vegetables, to get a full range of vitamins and minerals. Practice good food safety and cleanliness. Wash your hands before you eat and after you prepare raw meat. Wash all fruits and vegetables well before peeling or eating. Taking these actions can help to prevent foodborne illnesses, such as listeriosis, that can be very dangerous to your baby. Do not eat raw meat or fish. Do not eat fish that have high mercury content, such as tilefish, shark, swordfish, and king mackerel. Do not eat raw (unpasteurized) dairy. Take a prenatal vitamin to help meet your additional vitamin and mineral needs during pregnancy, specifically for folic acid, iron, calcium, and vitamin D. This information is not intended to replace advice given to you by your health care provider. Make sure you discuss any questions you have with your health care provider. Document Revised: 08/26/2019 Document Reviewed: 08/28/2019 Elsevier Patient Education  2024 [employer].

## 2023-03-22 LAB — URINALYSIS, ROUTINE W REFLEX MICROSCOPIC
Bilirubin, UA: NEGATIVE
Glucose, UA: NEGATIVE
Nitrite, UA: NEGATIVE
RBC, UA: NEGATIVE
Specific Gravity, UA: 1.025 (ref 1.005–1.030)
Urobilinogen, Ur: 1 mg/dL (ref 0.2–1.0)
pH, UA: 7 (ref 5.0–7.5)

## 2023-03-22 LAB — MICROSCOPIC EXAMINATION: Casts: NONE SEEN /[LPF]

## 2023-03-22 LAB — CERVICOVAGINAL ANCILLARY ONLY
Bacterial Vaginitis (gardnerella): NEGATIVE
Candida Glabrata: NEGATIVE
Candida Vaginitis: POSITIVE — AB
Chlamydia: NEGATIVE
Comment: NEGATIVE
Comment: NEGATIVE
Comment: NEGATIVE
Comment: NEGATIVE
Comment: NEGATIVE
Comment: NORMAL
Neisseria Gonorrhea: NEGATIVE
Trichomonas: NEGATIVE

## 2023-03-24 LAB — URINE CULTURE, OB REFLEX

## 2023-03-24 LAB — CULTURE, OB URINE

## 2023-03-25 LAB — MATERNIT 21 PLUS CORE, BLOOD
Fetal Fraction: 20
Result (T21): NEGATIVE
Trisomy 13 (Patau syndrome): NEGATIVE
Trisomy 18 (Edwards syndrome): NEGATIVE
Trisomy 21 (Down syndrome): NEGATIVE

## 2023-03-26 LAB — CANNABINOID (GC/MS), URINE
Cannabinoid: POSITIVE — AB
Carboxy THC (GC/MS): 24 ng/mL

## 2023-03-26 LAB — MONITOR DRUG PROFILE 14(MW)
Amphetamine Scrn, Ur: NEGATIVE ng/mL
BARBITURATE SCREEN URINE: NEGATIVE ng/mL
BENZODIAZEPINE SCREEN, URINE: NEGATIVE ng/mL
Buprenorphine, Urine: NEGATIVE ng/mL
Cocaine (Metab) Scrn, Ur: NEGATIVE ng/mL
Creatinine(Crt), U: 243 mg/dL (ref 20.0–300.0)
Fentanyl, Urine: NEGATIVE pg/mL
Meperidine Screen, Urine: NEGATIVE ng/mL
Methadone Screen, Urine: NEGATIVE ng/mL
OXYCODONE+OXYMORPHONE UR QL SCN: NEGATIVE ng/mL
Opiate Scrn, Ur: NEGATIVE ng/mL
Ph of Urine: 6.7 (ref 4.5–8.9)
Phencyclidine Qn, Ur: NEGATIVE ng/mL
Propoxyphene Scrn, Ur: NEGATIVE ng/mL
SPECIFIC GRAVITY: 1.017
Tramadol Screen, Urine: NEGATIVE ng/mL

## 2023-03-26 LAB — NICOTINE SCREEN, URINE: Cotinine Ql Scrn, Ur: NEGATIVE ng/mL

## 2023-03-27 LAB — CBC/D/PLT+RPR+RH+ABO+RUBIGG...
Antibody Screen: NEGATIVE
Basophils Absolute: 0 10*3/uL (ref 0.0–0.2)
Basos: 0 %
EOS (ABSOLUTE): 0.1 10*3/uL (ref 0.0–0.4)
Eos: 1 %
HCV Ab: NONREACTIVE
HIV Screen 4th Generation wRfx: NONREACTIVE
Hematocrit: 42.1 % (ref 34.0–46.6)
Hemoglobin: 14.2 g/dL (ref 11.1–15.9)
Hepatitis B Surface Ag: NEGATIVE
Immature Grans (Abs): 0 10*3/uL (ref 0.0–0.1)
Immature Granulocytes: 0 %
Lymphocytes Absolute: 1.7 10*3/uL (ref 0.7–3.1)
Lymphs: 16 %
MCH: 29.7 pg (ref 26.6–33.0)
MCHC: 33.7 g/dL (ref 31.5–35.7)
MCV: 88 fL (ref 79–97)
Monocytes Absolute: 0.6 10*3/uL (ref 0.1–0.9)
Monocytes: 5 %
Neutrophils Absolute: 8.6 10*3/uL — ABNORMAL HIGH (ref 1.4–7.0)
Neutrophils: 78 %
Platelets: 318 10*3/uL (ref 150–450)
RBC: 4.78 x10E6/uL (ref 3.77–5.28)
RDW: 13.9 % (ref 11.7–15.4)
RPR Ser Ql: NONREACTIVE
Rh Factor: POSITIVE
Rubella Antibodies, IGG: 15.3 {index} (ref >0.99)
Varicella zoster IgG: REACTIVE
WBC: 11 10*3/uL — ABNORMAL HIGH (ref 3.4–10.8)

## 2023-03-27 LAB — HGB FRACTIONATION CASCADE
Hgb A2: 2.6 % (ref 1.8–3.2)
Hgb A: 97.4 % (ref 96.4–98.8)
Hgb F: 0 % (ref 0.0–2.0)
Hgb S: 0 %

## 2023-03-27 LAB — HCV INTERPRETATION

## 2023-04-06 ENCOUNTER — Ambulatory Visit: Payer: Medicaid Other

## 2023-04-06 ENCOUNTER — Telehealth: Payer: Self-pay

## 2023-04-06 NOTE — Progress Notes (Deleted)
    NURSE VISIT NOTE  Subjective:    Patient ID: Norma Deleon, female    DOB: 09-07-2000, 22 y.o.   MRN: [id number]  HPI  Patient is a 23 y.o. G96P0000 female who presents for BP check, patient had contacted nurse triage this morning stating that she was having frequent headaches for the past 2 weeks and feeling like she had no energy . Patient reported to nurse triage sensitivity to light but denied blurred vision, chest pain and shortness of breath. Patient denied taking any over the counter mediation to treat headaches.      BP Readings from Last 3 Encounters:  03/21/23 128/83  03/01/23 119/80  02/22/23 134/75   Pulse Readings from Last 3 Encounters:  03/21/23 89  03/01/23 93  02/22/23 66    Objective:    LMP 12/26/2022 (Exact Date)   Assessment:   No diagnosis found.   Plan:   Per Dr. Andria Deleon Providers:[id number]}:  {[id number]}  Patient verbalized understanding of instructions.   Norma Deleon, CMA

## 2023-04-06 NOTE — Telephone Encounter (Signed)
TRIAGE VOICEMAIL: Patient requesting if she can have a referral to [facility] to have labs done instead of just blood pressure.

## 2023-04-06 NOTE — Telephone Encounter (Signed)
Received recording, We're sorry, your call cannot be completed at this time. Please hang up and try your call again later.

## 2023-04-06 NOTE — Telephone Encounter (Signed)
Patient calling in to report lack of energy along with headaches for the last 2 weeks. Reports not taking anything for relief but trying to stay hydrated. She states concerns of "feeling like she may pass out and being sensitive to light. Has not been able to check her BP or HR, no history of blood sugar issues or anemia. Recent CBC on 2/5 normal range. Patient is requesting to have her vitals checked today. Nurse visit today at 11:15.

## 2023-04-08 ENCOUNTER — Encounter: Payer: Self-pay | Admitting: Advanced Practice Midwife

## 2023-04-08 ENCOUNTER — Other Ambulatory Visit: Payer: Self-pay | Admitting: Advanced Practice Midwife

## 2023-04-08 DIAGNOSIS — R8271 Bacteriuria: Secondary | ICD-10-CM

## 2023-04-08 MED ORDER — AMPICILLIN 500 MG PO CAPS: 500.0000 mg | ORAL_CAPSULE | Freq: Three times a day (TID) | ORAL | 0 refills | Status: DC

## 2023-04-08 NOTE — Progress Notes (Signed)
 Rx ampicillin sent to treat GBS bacteriuria. Recommend OTC Monistat to treat candida noted on aptima. (No symptoms at NOB).

## 2023-04-18 ENCOUNTER — Ambulatory Visit (INDEPENDENT_AMBULATORY_CARE_PROVIDER_SITE_OTHER): Payer: Medicaid Other | Admitting: Certified Nurse Midwife

## 2023-04-18 ENCOUNTER — Encounter: Payer: Self-pay | Admitting: Certified Nurse Midwife

## 2023-04-18 VITALS — BP 130/77 | HR 73 | Wt 158.1 lb

## 2023-04-18 DIAGNOSIS — Z3A16 16 weeks gestation of pregnancy: Secondary | ICD-10-CM | POA: Diagnosis not present

## 2023-04-18 DIAGNOSIS — Z363 Encounter for antenatal screening for malformations: Secondary | ICD-10-CM | POA: Diagnosis not present

## 2023-04-18 DIAGNOSIS — Z3402 Encounter for supervision of normal first pregnancy, second trimester: Secondary | ICD-10-CM

## 2023-04-18 DIAGNOSIS — Z1379 Encounter for other screening for genetic and chromosomal anomalies: Secondary | ICD-10-CM

## 2023-04-18 NOTE — Progress Notes (Signed)
    Return Prenatal Note   Subjective   23 y.o. G1P0000 at [redacted]w[redacted]d presents for this follow-up prenatal visit.  Patient feeling well, interested in waterbirth. Desires AFP today. Patient reports: Movement: Absent Contractions: Not present  Objective   Flow sheet Vitals: Pulse Rate: 73 BP: 130/77 Fetal Heart Rate (bpm): 155 Total weight gain: -1 lb 14.4 oz (-0.862 kg)  General Appearance  No acute distress, well appearing, and well nourished Pulmonary   Normal work of breathing Neurologic   Alert and oriented to person, place, and time Psychiatric   Mood and affect within normal limits  Assessment/Plan   Plan  23 y.o. G1P0000 at [redacted]w[redacted]d presents for follow-up OB visit. Reviewed prenatal record including previous visit note.  Supervision of normal pregnancy Red flag symptoms reviewed. Information regarding water birth sent via MyChart, will need to complete class, rent tub to have at [facility].      Orders Placed This Encounter  Procedures   US OB Comp + 14 Wk    Standing Status:   Future    Expected Date:   05/19/2023    Expiration Date:   07/19/2023    Reason for Exam (SYMPTOM  OR DIAGNOSIS REQUIRED):   anatomy ultrasound    Preferred Imaging Location?:   Internal   AFP, Serum, Open Spina Bifida    Is patient insulin dependent?:   No    Weight (lbs):   158    Gestational Age (GA), weeks:   41    Date on which patient was at this GA:   04/17/2023    GA Calculation Method:   LMP    Number of fetuses:   1    Remote health to draw?:   No   Return in 4 weeks (on 05/16/2023) for ROB, anatomy ultrasound.   No future appointments.  For next visit:  continue with routine prenatal care     Dominica Severin, CNM  04/17/2509:59 AM

## 2023-04-18 NOTE — Patient Instructions (Signed)
 Second Trimester of Pregnancy  The second trimester of pregnancy is from week 14 through week 27. This is months 4 through 6 of pregnancy. During the second trimester: Morning sickness is less or has stopped. You may have more energy. You may feel hungry more often. At this time, your unborn baby is growing very fast. At the end of the sixth month, the unborn baby may be up to 12 inches long and weigh about 1 pounds. You will likely start to feel the baby move between 16 and 20 weeks of pregnancy. Body changes during your second trimester Your body continues to change during this time. The changes usually go away after your baby is born. Physical changes You will gain more weight. Your belly will get bigger. You may begin to get stretch marks on your hips, belly, and breasts. Your breasts will keep growing and may hurt. You may get dark spots or blotches on your face. A dark line from your belly button to the pubic area may appear. This line is called linea nigra. Your hair may grow faster and get thicker. Health changes You may have headaches. You may have heartburn. You may pee more often. You may have swollen, bulging veins (varicose veins). You may have trouble pooping (constipation), or swollen veins in the butt that can itch or get painful (hemorrhoids). You may have back pain. This is caused by: Weight gain. Pregnancy hormones that are relaxing the joints in your pelvis. Follow these instructions at home: Medicines Talk to your health care provider if you're taking medicines. Ask if the medicines are safe to take during pregnancy. Your provider may change the medicines that you take. Do not take any medicines unless told to by your provider. Take a prenatal vitamin that has at least 600 micrograms (mcg) of folic acid. Do not use herbal medicines, illegal drugs, or medicines that are not approved by your provider. Eating and drinking While you're pregnant your body needs  extra food for your growing baby. Talk with your provider about what to eat while pregnant. Activity Most women are able to exercise during pregnancy. Exercises may need to change as your pregnancy goes on. Talk to your provider about your activities and exercise routines. Relieving pain and discomfort Wear a good, supportive bra if your breasts hurt. Rest with your legs raised if you have leg cramps or low back pain. Take warm sitz baths to soothe pain from hemorrhoids. Use hemorrhoid cream if your provider says it's okay. Do not douche. Do not use tampons or scented pads. Do not use hot tubs, steam rooms, or saunas. Safety Wear your seatbelt at all times when you're in a car. Talk to your provider if someone hits you, hurts you, or yells at you. Talk with your provider if you're feeling sad or have thoughts of hurting yourself. Lifestyle Certain things can be harmful while you're pregnant. It's best to avoid the following: Do not drink alcohol,smoke, vape, or use products with nicotine or tobacco in them. If you need help quitting, talk with your provider. Avoid cat litter boxes and soil used by cats. These things carry germs that can cause harm to your pregnancy and your baby. General instructions Keep all follow-up visits. It helps you and your unborn baby stay as healthy as possible. Write down your questions. Take them to your prenatal visits. Your provider will: Talk with you about your overall health. Give you advice or refer you to specialists who can help with different needs,  including: Prenatal education classes. Mental health and counseling. Foods and healthy eating. Ask for help if you need help with food. Where to find more information American Pregnancy Association: americanpregnancy.org [employer]ians [employer] Gynecologists: acog.org Office on [employer] Health: [employer] Contact a health care provider if: You have a headache that does not go away  when you take medicine. You have any of these problems: You can't eat or drink. You throw up or feel like you may throw up. You have watery poop (diarrhea) for 2 days or more. You have pain when you pee or your pee smells bad. You have been sick for 2 days or more and are not getting better. Contact your provider right away if: You have any of these coming from your vagina: Abnormal discharge. Bad-smelling fluid. Bleeding. Your baby is moving less than usual. You have contractions, belly cramping, or have pain in your pelvis or lower back. You have symptoms of high blood pressure or preeclampsia. These include: A severe, throbbing headache that does not go away. Sudden or extreme swelling of your face, hands, legs, or feet. Vision problems: You see spots. You have blurry vision. Your eyes are sensitive to light. If you can't reach the provider, go to an urgent care or emergency room. Get help right away if: You faint, become confused, or can't think clearly. You have chest pain or trouble breathing. You have any kind of injury, such as from a fall or a car crash. These symptoms may be an emergency. Call 911 right away. Do not wait to see if the symptoms will go away. Do not drive yourself to the hospital. This information is not intended to replace advice given to you by your health care provider. Make sure you discuss any questions you have with your health care provider. Document Revised: 11/02/2022 Document Reviewed: 06/02/2022 Elsevier Patient Education  2024 [employer].

## 2023-04-18 NOTE — Assessment & Plan Note (Signed)
 Red flag symptoms reviewed. Information regarding water birth sent via MyChart, will need to complete class, rent tub to have at [facility].

## 2023-04-20 LAB — AFP, SERUM, OPEN SPINA BIFIDA
AFP MoM: 0.82
AFP Value: 29.2 ng/mL
Gest. Age on Collection Date: 16.1 wk
Maternal Age At EDD: 20.9 a
OSBR Risk 1 IN: 10000
Test Results:: NEGATIVE
Weight: 158 [lb_av]

## 2023-04-23 ENCOUNTER — Encounter: Payer: Self-pay | Admitting: Certified Nurse Midwife

## 2023-04-29 ENCOUNTER — Encounter (HOSPITAL_COMMUNITY): Payer: Self-pay | Admitting: Obstetrics & Gynecology

## 2023-04-29 ENCOUNTER — Inpatient Hospital Stay (HOSPITAL_COMMUNITY)
Admission: AD | Admit: 2023-04-29 | Discharge: 2023-04-30 | Disposition: A | Discharge status: Home / Self Care | Attending: Obstetrics & Gynecology | Admitting: Obstetrics & Gynecology

## 2023-04-29 ENCOUNTER — Ambulatory Visit
Admission: EM | Admit: 2023-04-29 | Discharge: 2023-04-29 | Disposition: A | Discharge status: Home / Self Care | Attending: Urgent Care | Admitting: Urgent Care

## 2023-04-29 DIAGNOSIS — N760 Acute vaginitis: Secondary | ICD-10-CM | POA: Diagnosis present

## 2023-04-29 DIAGNOSIS — O99112 Other diseases of the blood and blood-forming organs and certain disorders involving the immune mechanism complicating pregnancy, second trimester: Secondary | ICD-10-CM | POA: Insufficient documentation

## 2023-04-29 DIAGNOSIS — Z3A17 17 weeks gestation of pregnancy: Secondary | ICD-10-CM | POA: Diagnosis not present

## 2023-04-29 DIAGNOSIS — N949 Unspecified condition associated with female genital organs and menstrual cycle: Secondary | ICD-10-CM | POA: Diagnosis not present

## 2023-04-29 DIAGNOSIS — O26892 Other specified pregnancy related conditions, second trimester: Secondary | ICD-10-CM | POA: Diagnosis not present

## 2023-04-29 DIAGNOSIS — O26612 Liver and biliary tract disorders in pregnancy, second trimester: Secondary | ICD-10-CM | POA: Diagnosis not present

## 2023-04-29 DIAGNOSIS — O99612 Diseases of the digestive system complicating pregnancy, second trimester: Secondary | ICD-10-CM | POA: Diagnosis not present

## 2023-04-29 DIAGNOSIS — D72829 Elevated white blood cell count, unspecified: Secondary | ICD-10-CM | POA: Insufficient documentation

## 2023-04-29 DIAGNOSIS — R102 Pelvic and perineal pain: Secondary | ICD-10-CM | POA: Insufficient documentation

## 2023-04-29 DIAGNOSIS — R109 Unspecified abdominal pain: Secondary | ICD-10-CM | POA: Diagnosis present

## 2023-04-29 DIAGNOSIS — K5900 Constipation, unspecified: Secondary | ICD-10-CM | POA: Insufficient documentation

## 2023-04-29 DIAGNOSIS — K8020 Calculus of gallbladder without cholecystitis without obstruction: Secondary | ICD-10-CM | POA: Diagnosis not present

## 2023-04-29 LAB — COMPREHENSIVE METABOLIC PANEL
ALT: 12 U/L (ref 0–44)
AST: 16 U/L (ref 15–41)
Albumin: 2.8 g/dL — ABNORMAL LOW (ref 3.5–5.0)
Alkaline Phosphatase: 39 U/L (ref 38–126)
Anion gap: 7 (ref 5–15)
BUN: 7 mg/dL (ref 6–20)
CO2: 22 mmol/L (ref 22–32)
Calcium: 8.7 mg/dL — ABNORMAL LOW (ref 8.9–10.3)
Chloride: 106 mmol/L (ref 98–111)
Creatinine, Ser: 0.63 mg/dL (ref 0.44–1.00)
GFR, Estimated: >60 mL/min (ref >60)
Glucose, Bld: 105 mg/dL — ABNORMAL HIGH (ref 70–99)
Potassium: 3.8 mmol/L (ref 3.5–5.1)
Sodium: 135 mmol/L (ref 135–145)
Total Bilirubin: 0.4 mg/dL (ref 0.0–1.2)
Total Protein: 5.8 g/dL — ABNORMAL LOW (ref 6.5–8.1)

## 2023-04-29 LAB — URINALYSIS, ROUTINE W REFLEX MICROSCOPIC
Bilirubin Urine: NEGATIVE
Glucose, UA: NEGATIVE mg/dL
Hgb urine dipstick: NEGATIVE
Ketones, ur: NEGATIVE mg/dL
Leukocytes,Ua: NEGATIVE
Nitrite: NEGATIVE
Protein, ur: NEGATIVE mg/dL
Specific Gravity, Urine: 1.015 (ref 1.005–1.030)
pH: 6 (ref 5.0–8.0)

## 2023-04-29 LAB — CBC
HCT: 32.1 % — ABNORMAL LOW (ref 36.0–46.0)
Hemoglobin: 11 g/dL — ABNORMAL LOW (ref 12.0–15.0)
MCH: 30.5 pg (ref 26.0–34.0)
MCHC: 34.3 g/dL (ref 30.0–36.0)
MCV: 88.9 fL (ref 80.0–100.0)
Platelets: 266 10*3/uL (ref 150–400)
RBC: 3.61 MIL/uL — ABNORMAL LOW (ref 3.87–5.11)
RDW: 13.2 % (ref 11.5–15.5)
WBC: 13 10*3/uL — ABNORMAL HIGH (ref 4.0–10.5)
nRBC: 0 % (ref 0.0–0.2)

## 2023-04-29 LAB — WET PREP, GENITAL
Clue Cells Wet Prep HPF POC: NONE SEEN
Sperm: NONE SEEN
Trich, Wet Prep: NONE SEEN
WBC, Wet Prep HPF POC: <10 (ref <10)
Yeast Wet Prep HPF POC: NONE SEEN

## 2023-04-29 LAB — POCT URINALYSIS DIP (MANUAL ENTRY)
Bilirubin, UA: NEGATIVE
Blood, UA: NEGATIVE
Glucose, UA: NEGATIVE mg/dL
Ketones, POC UA: NEGATIVE mg/dL
Nitrite, UA: NEGATIVE
Protein Ur, POC: NEGATIVE mg/dL
Spec Grav, UA: 1.01 (ref 1.010–1.025)
Urobilinogen, UA: 0.2 U/dL
pH, UA: 6.5 (ref 5.0–8.0)

## 2023-04-29 MED ORDER — CYCLOBENZAPRINE HCL 5 MG PO TABS
10.0000 mg | ORAL_TABLET | Freq: Once | ORAL | Status: AC
  Administered 2023-04-29: 10 mg via ORAL
  Filled 2023-04-29: qty 2

## 2023-04-29 MED ORDER — ACETAMINOPHEN 500 MG PO TABS
1000.0000 mg | ORAL_TABLET | Freq: Once | ORAL | Status: AC
  Administered 2023-04-29: 1000 mg via ORAL
  Filled 2023-04-29: qty 2

## 2023-04-29 NOTE — MAU Provider Note (Signed)
 Chief Complaint: Abdominal Pain   Event Date/Time   First Provider Initiated Contact with Patient 04/29/23 2111      SUBJECTIVE HPI: Norma Deleon is a 23 y.o. G1P0000 at [redacted]w[redacted]d by LMP who presents to maternity admissions reporting abdominal pain.  Notes mostly right lower quadrant pain that started this morning.  Has also been left lower quadrant but this has been less so.  She notes that movement makes the pain worse.  Has also been constipated.  Pain is crampy in nature.  Does note that she was walking more than usual yesterday.  Denies vaginal bleeding, loss of fluid, period-type cramps, fever/chills, urinary symptoms.  She does note a new yellow, malodorous discharge.  She is feeling fetal movement.  HPI  History reviewed. No pertinent past medical history. Past Surgical History:  Procedure Laterality Date   NO PAST SURGERIES     Social History   Socioeconomic History   Marital status: Single    Spouse name: Not on file   Number of children: 0   Years of education: 59   Highest education level: High school graduate  Occupational History   Not on file  Tobacco Use   Smoking status: Former    Types: E-cigarettes, Cigars    Passive exposure: Past   Smokeless tobacco: Never  Vaping Use   Vaping status: Never Used  Substance and Sexual Activity   Alcohol use: Not Currently    Alcohol/week: 5.0 standard drinks of alcohol    Types: 5 Shots of liquor per week    Comment: occassionally   Drug use: Not Currently    Types: Marijuana    Comment: January 2025   Sexual activity: Not Currently    Partners: Male    Birth control/protection: Condom  Other Topics Concern   Not on file  Social History Narrative   Not on file   Social Drivers of Health   Financial Resource Strain: Not on file  Food Insecurity: No Food Insecurity (03/05/2023)   Hunger Vital Sign    Worried About Running Out of Food in the Last Year: Never true    Ran Out of Food in the Last Year: Never true   Transportation Needs: No Transportation Needs (03/05/2023)   PRAPARE - Administrator, Civil Service (Medical): No    Lack of Transportation (Non-Medical): No  Physical Activity: Not on file  Stress: No Stress Concern Present (03/05/2023)   [employer] of Occupational Health - Occupational Stress Questionnaire    Feeling of Stress : Not at all  Social Connections: Not on file  Intimate Partner Violence: Not At Risk (03/05/2023)   Humiliation, Afraid, Rape, and Kick questionnaire    Fear of Current or Ex-Partner: No    Emotionally Abused: No    Physically Abused: No    Sexually Abused: No   No current facility-administered medications on file prior to encounter.   Current Outpatient Medications on File Prior to Encounter  Medication Sig Dispense Refill   Prenatal Vit-Fe Fumarate-FA (PRENATAL VITAMINS) 27-0.8 MG TABS Take 1 capsule by mouth daily.     No Known Allergies  ROS:  Pertinent positives/negatives listed above.  I have reviewed patient's Past Medical Hx, Surgical Hx, Family Hx, Social Hx, medications and allergies.   Physical Exam  Patient Vitals for the past 24 hrs:  BP Temp Temp src Pulse Resp SpO2 Height Weight  04/29/23 2325 124/60 98.7 F (37.1 C) Oral (!) 59 -- -- -- --  04/29/23 2109  132/73 -- -- 71 -- -- -- --  04/29/23 2050 134/72 98.4 F (36.9 C) Oral 93 17 99 % 5\' 5"  (1.651 m) 73.8 kg   Constitutional: Well-developed, well-nourished female in no acute distress.  Appears mildly uncomfortable Cardiovascular: normal rate Respiratory: normal effort GI: Abd soft, tender to right lower quadrant.  Pain worse with palpation versus release.  Patient states that she does have pain in bilateral groins but not currently MS: Extremities nontender, no edema GU: Neg CVAT  Doppler: 154  LAB RESULTS Results for orders placed or performed during the hospital encounter of 04/29/23 (from the past 24 hours)  Urinalysis, Routine w reflex microscopic  -Urine, Clean Catch     Status: Abnormal   Collection Time: 04/29/23  9:29 PM  Result Value Ref Range   Color, Urine YELLOW YELLOW   APPearance CLOUDY (A) CLEAR   Specific Gravity, Urine 1.015 1.005 - 1.030   pH 6.0 5.0 - 8.0   Glucose, UA NEGATIVE NEGATIVE mg/dL   Hgb urine dipstick NEGATIVE NEGATIVE   Bilirubin Urine NEGATIVE NEGATIVE   Ketones, ur NEGATIVE NEGATIVE mg/dL   Protein, ur NEGATIVE NEGATIVE mg/dL   Nitrite NEGATIVE NEGATIVE   Leukocytes,Ua NEGATIVE NEGATIVE  Wet prep, genital     Status: None   Collection Time: 04/29/23  9:38 PM   Specimen: Vaginal  Result Value Ref Range   Yeast Wet Prep HPF POC NONE SEEN NONE SEEN   Trich, Wet Prep NONE SEEN NONE SEEN   Clue Cells Wet Prep HPF POC NONE SEEN NONE SEEN   WBC, Wet Prep HPF POC <10 <10   Sperm NONE SEEN   CBC     Status: Abnormal   Collection Time: 04/29/23 10:15 PM  Result Value Ref Range   WBC 13.0 (H) 4.0 - 10.5 K/uL   RBC 3.61 (L) 3.87 - 5.11 MIL/uL   Hemoglobin 11.0 (L) 12.0 - 15.0 g/dL   HCT 40.9 (L) 81.1 - 91.4 %   MCV 88.9 80.0 - 100.0 fL   MCH 30.5 26.0 - 34.0 pg   MCHC 34.3 30.0 - 36.0 g/dL   RDW 78.2 95.6 - 21.3 %   Platelets 266 150 - 400 K/uL   nRBC 0.0 0.0 - 0.2 %  Comprehensive metabolic panel     Status: Abnormal   Collection Time: 04/29/23 10:15 PM  Result Value Ref Range   Sodium 135 135 - 145 mmol/L   Potassium 3.8 3.5 - 5.1 mmol/L   Chloride 106 98 - 111 mmol/L   CO2 22 22 - 32 mmol/L   Glucose, Bld 105 (H) 70 - 99 mg/dL   BUN 7 6 - 20 mg/dL   Creatinine, Ser 0.86 0.44 - 1.00 mg/dL   Calcium 8.7 (L) 8.9 - 10.3 mg/dL   Total Protein 5.8 (L) 6.5 - 8.1 g/dL   Albumin 2.8 (L) 3.5 - 5.0 g/dL   AST 16 15 - 41 U/L   ALT 12 0 - 44 U/L   Alkaline Phosphatase 39 38 - 126 U/L   Total Bilirubin 0.4 0.0 - 1.2 mg/dL   GFR, Estimated >57 >84 mL/min   Anion gap 7 5 - 15    O/Positive/-- (02/05 1044)  IMAGING No results found.  MAU Management/MDM: Orders Placed This Encounter  Procedures    Wet prep, genital   MR ABDOMEN WO CONTRAST   Urinalysis, Routine w reflex microscopic -Urine, Clean Catch   CBC   Comprehensive metabolic panel   Discharge patient  Meds ordered this encounter  Medications   acetaminophen (TYLENOL) tablet 1,000 mg   cyclobenzaprine (FLEXERIL) tablet 10 mg   acetaminophen (TYLENOL) 500 MG tablet    Sig: Take 2 tablets (1,000 mg total) by mouth every 8 (eight) hours as needed for mild pain (pain score 1-3).    Dispense:  100 tablet    Refill:  1   cyclobenzaprine (FLEXERIL) 10 MG tablet    Sig: Take 1 tablet (10 mg total) by mouth 3 (three) times daily as needed for muscle spasms.    Dispense:  30 tablet    Refill:  2   senna (SENOKOT) 8.6 MG TABS tablet    Sig: Take 2 tablets (17.2 mg total) by mouth daily as needed for mild constipation.    Dispense:  120 tablet    Refill:  0   polyethylene glycol powder (GLYCOLAX/MIRALAX) 17 GM/SCOOP powder    Sig: Take 17 g by mouth daily as needed for moderate constipation.    Dispense:  255 g    Refill:  0    Patient presents with predominantly right lower quadrant pain that started this morning at 17 weeks 5 days pregnant.  Do suspect an element of MSK/round ligament pain given the pain is in bilateral groins and worsens with movement.  Also possibility for UTI/vaginitis/constipation.  However she is tender more so than I would expect on palpation of right lower quadrant.  She is not febrile/tachycardic, so have low (but not none) suspicion  for appendicitis.  Will obtain urine, wet prep, GC/C will obtain CBC, CMP to fully rule out appendicitis.  Will also treat symptoms with Tylenol and Flexeril.  2232: Reassessed patient after results obtained.  She does state that her pain is significantly better after Tylenol and Flexeril.  However still does have some tenderness but less in her right lower quadrant.  She does not have signs of a UTI/vaginitis.  She does have a very mildly elevated white count at 13.  She  does have a history of mildly elevated white count earlier in pregnancy, however this is more so than a month ago.  Discussed with patient that I have low suspicion that she has appendicitis, however with exam and mildly elevated white count cannot fully rule out at this time.  Discussed going home and watching for symptoms such as fever, nausea/vomiting, worsening pain versus obtaining an MRI tonight.  With shared decision making, patient elected to pursue MRI to fully rule out appendicitis.  This is ordered.  2841: Patient reassessed at the bedside.  Sleeping comfortably upon my entrance to the room.  Discussed that her MRI does not show any signs of appendicitis.  I discussed read with on-call radiologist.  He states that a full read will be available after 5 AM, however there are no signs of appendicitis per his initial read.  Discussed with patient that I do feel her pain is a mix of MSK/round ligament pain and constipation.  Will send home with Tylenol/Flexeril and start on a bowel regimen of senna and/or MiraLAX.  Do suspect her leukocytosis is pregnancy related given it is mild.  FWB: Normal Dopplers.  ASSESSMENT 1. Constipation during pregnancy in second trimester   2. Leukocytosis, unspecified type   3. Round ligament pain   4. [redacted] weeks gestation of pregnancy     PLAN Discharge home with strict return precautions. Allergies as of 04/30/2023   No Known Allergies      Medication List  TAKE these medications    acetaminophen 500 MG tablet Commonly known as: TYLENOL Take 2 tablets (1,000 mg total) by mouth every 8 (eight) hours as needed for mild pain (pain score 1-3).   cyclobenzaprine 10 MG tablet Commonly known as: FLEXERIL Take 1 tablet (10 mg total) by mouth 3 (three) times daily as needed for muscle spasms.   polyethylene glycol powder 17 GM/SCOOP powder Commonly known as: GLYCOLAX/MIRALAX Take 17 g by mouth daily as needed for moderate constipation.   Prenatal  Vitamins 27-0.8 MG Tabs Take 1 capsule by mouth daily.   senna 8.6 MG Tabs tablet Commonly known as: SENOKOT Take 2 tablets (17.2 mg total) by mouth daily as needed for mild constipation.         Wylene Simmer, MD OB Fellow 04/30/2023  2:46 AM

## 2023-04-29 NOTE — MAU Note (Signed)
.  Norma Deleon is a 23 y.o. at [redacted]w[redacted]d here in MAU reporting: frequent intermittent abdominal pain that began at 1045 this morning-reports cramping, aching pain. Also reports yellow discharge, without odor or itching Denies vaginal bleeding or sudden loss of fluid Reports + fetal movement.   Onset of complaint: 1045 Pain score: 5 abdomen Vitals:   04/29/23 2050  BP: 134/72  Pulse: 93  Resp: 17  Temp: 98.4 F (36.9 C)  SpO2: 99%     FHT:154bpm Lab orders placed from triage:

## 2023-04-29 NOTE — ED Triage Notes (Signed)
 Pt reports yellow vaginal discharge x 1 day; right lower quadrant pain on and off since this morning.  Pt reports she is [redacted] week pregnant.

## 2023-04-29 NOTE — Discharge Instructions (Addendum)
 Will base treatment off your results as you are pregnant and should not take medication that is not needed. Results should be available in 24-48 hours.

## 2023-04-29 NOTE — MAU Provider Note (Incomplete)
 Chief Complaint: Abdominal Pain   Event Date/Time   First Provider Initiated Contact with Patient 04/29/23 2111      SUBJECTIVE HPI: Norma Deleon is a 23 y.o. G1P0000 at [redacted]w[redacted]d by LMP who presents to maternity admissions reporting abdominal pain.  Notes mostly right lower quadrant pain that started this morning.  Has also been left lower quadrant but this has been left so.  She notes that movement makes the pain worse.  Has also been constipated.  Pain is crampy in nature.  Does note that she was walking more than usual yesterday.  Denies vaginal bleeding, loss of fluid, period-type cramps, fever/chills, urinary symptoms.  She does note a new yellow, malodorous discharge.  She is feeling fetal movement.  HPI  History reviewed. No pertinent past medical history. Past Surgical History:  Procedure Laterality Date  . NO PAST SURGERIES     Social History   Socioeconomic History  . Marital status: Single    Spouse name: Not on file  . Number of children: 0  . Years of education: 8  . Highest education level: High school graduate  Occupational History  . Not on file  Tobacco Use  . Smoking status: Former    Types: E-cigarettes, Cigars    Passive exposure: Past  . Smokeless tobacco: Never  Vaping Use  . Vaping status: Never Used  Substance and Sexual Activity  . Alcohol use: Not Currently    Alcohol/week: 5.0 standard drinks of alcohol    Types: 5 Shots of liquor per week    Comment: occassionally  . Drug use: Not Currently    Types: Marijuana    Comment: January 2025  . Sexual activity: Not Currently    Partners: Male    Birth control/protection: Condom  Other Topics Concern  . Not on file  Social History Narrative  . Not on file   Social Drivers of Health   Financial Resource Strain: Not on file  Food Insecurity: No Food Insecurity (03/05/2023)   Hunger Vital Sign   . Worried About Programme researcher, broadcasting/film/video in the Last Year: Never true   . Ran Out of Food in the Last  Year: Never true  Transportation Needs: No Transportation Needs (03/05/2023)   PRAPARE - Transportation   . Lack of Transportation (Medical): No   . Lack of Transportation (Non-Medical): No  Physical Activity: Not on file  Stress: No Stress Concern Present (03/05/2023)   [employer] of Occupational Health - Occupational Stress Questionnaire   . Feeling of Stress : Not at all  Social Connections: Not on file  Intimate Partner Violence: Not At Risk (03/05/2023)   Humiliation, Afraid, Rape, and Kick questionnaire   . Fear of Current or Ex-Partner: No   . Emotionally Abused: No   . Physically Abused: No   . Sexually Abused: No   No current facility-administered medications on file prior to encounter.   Current Outpatient Medications on File Prior to Encounter  Medication Sig Dispense Refill  . Prenatal Vit-Fe Fumarate-FA (PRENATAL VITAMINS) 27-0.8 MG TABS Take 1 capsule by mouth daily.     No Known Allergies  ROS:  Pertinent positives/negatives listed above.  I have reviewed patient's Past Medical Hx, Surgical Hx, Family Hx, Social Hx, medications and allergies.   Physical Exam  Patient Vitals for the past 24 hrs:  BP Temp Temp src Pulse Resp SpO2 Height Weight  04/29/23 2109 132/73 -- -- 87 -- -- -- --  04/29/23 2050 134/72 98.4 F (36.9  C) Oral 93 17 99 % 5\' 5"  (1.651 m) 73.8 kg   Constitutional: Well-developed, well-nourished female in no acute distress.  Appears mildly uncomfortable Cardiovascular: normal rate Respiratory: normal effort GI: Abd soft, tender to right lower quadrant.  Pain worse with palpation versus release.  Patient states that she does have pain in bilateral groins but not currently MS: Extremities nontender, no edema GU: Neg CVAT  Doppler: 154  LAB RESULTS Results for orders placed or performed during the hospital encounter of 04/29/23 (from the past 24 hours)  Urinalysis, Routine w reflex microscopic -Urine, Clean Catch     Status: Abnormal    Collection Time: 04/29/23  9:29 PM  Result Value Ref Range   Color, Urine YELLOW YELLOW   APPearance CLOUDY (A) CLEAR   Specific Gravity, Urine 1.015 1.005 - 1.030   pH 6.0 5.0 - 8.0   Glucose, UA NEGATIVE NEGATIVE mg/dL   Hgb urine dipstick NEGATIVE NEGATIVE   Bilirubin Urine NEGATIVE NEGATIVE   Ketones, ur NEGATIVE NEGATIVE mg/dL   Protein, ur NEGATIVE NEGATIVE mg/dL   Nitrite NEGATIVE NEGATIVE   Leukocytes,Ua NEGATIVE NEGATIVE  Wet prep, genital     Status: None   Collection Time: 04/29/23  9:38 PM   Specimen: Vaginal  Result Value Ref Range   Yeast Wet Prep HPF POC NONE SEEN NONE SEEN   Trich, Wet Prep NONE SEEN NONE SEEN   Clue Cells Wet Prep HPF POC NONE SEEN NONE SEEN   WBC, Wet Prep HPF POC <10 <10   Sperm NONE SEEN   CBC     Status: Abnormal   Collection Time: 04/29/23 10:15 PM  Result Value Ref Range   WBC 13.0 (H) 4.0 - 10.5 K/uL   RBC 3.61 (L) 3.87 - 5.11 MIL/uL   Hemoglobin 11.0 (L) 12.0 - 15.0 g/dL   HCT 08.6 (L) 57.8 - 46.9 %   MCV 88.9 80.0 - 100.0 fL   MCH 30.5 26.0 - 34.0 pg   MCHC 34.3 30.0 - 36.0 g/dL   RDW 62.9 52.8 - 41.3 %   Platelets 266 150 - 400 K/uL   nRBC 0.0 0.0 - 0.2 %    O/Positive/-- (02/05 1044)  IMAGING No results found.  MAU Management/MDM: Orders Placed This Encounter  Procedures  . Wet prep, genital  . MR ABDOMEN WO CONTRAST  . Urinalysis, Routine w reflex microscopic -Urine, Clean Catch  . CBC  . Comprehensive metabolic panel    Meds ordered this encounter  Medications  . acetaminophen (TYLENOL) tablet 1,000 mg  . cyclobenzaprine (FLEXERIL) tablet 10 mg    Patient presents with predominantly right lower quadrant pain that started this morning at 17 weeks 5 days pregnant.  Do suspect an element of MSK/round ligament pain given the pain is in bilateral groins and worsens with movement.  Also possibility for UTI/vaginitis/constipation.  However she is tender more so than I would expect on palpation of right lower quadrant.   She is not febrile/tachycardic, so have low (but not none) suspicion  for appendicitis.  Will obtain urine, wet prep, GC/C will obtain CBC, CMP to fully rule out appendicitis.  Will also treat symptoms with Tylenol and Flexeril.  2232: Reassessed patient after results obtained.  She does state that her pain is significantly better after Tylenol and Flexeril.  However still does have some tenderness but less in her right lower quadrant.  She does not have signs of a UTI/vaginitis.  She does have a very mildly elevated white  count at 13.  She does have a history of mildly elevated white count earlier in pregnancy, however this is more so than a month ago.  Discussed with patient that I have low suspicion that she has appendicitis, however with exam and mildly elevated white count cannot fully rule out at this time.  Discussed going home and watching for symptoms such as fever, nausea/vomiting, worsening pain versus obtaining an MRI tonight.  With shared decision making, patient elected to pursue MRI to fully rule out appendicitis.  This is ordered.  ***  ASSESSMENT No diagnosis found.  PLAN Discharge home with strict return precautions. Allergies as of 04/29/2023   No Known Allergies   Med Rec must be completed prior to using this [facility]***        Wylene Simmer, MD OB Fellow 04/29/2023  10:45 PM

## 2023-04-29 NOTE — ED Provider Notes (Signed)
  [facility]  Note:  This document was prepared using Conservation officer, historic buildings and may include unintentional dictation errors.  MRN: [id number] DOB: September 11, 2000  Subjective:   Norma Deleon is a 23 y.o. female [redacted] weeks pregnant presenting for 1 day history of yellow vaginal discharge.  Has had mild pelvic cramping.  No vaginal bleeding, dysuria, urinary symptoms.  No fever, vomiting.  No current facility-administered medications for this encounter.  Current Outpatient Medications:    Prenatal Vit-Fe Fumarate-FA (PRENATAL VITAMINS) 27-0.8 MG TABS, Take 1 capsule by mouth daily., Disp: , Rfl:    No Known Allergies  No past medical history on file.   Past Surgical History:  Procedure Laterality Date   NO PAST SURGERIES      Family History  Problem Relation Age of Onset   Diabetes Mother    Heart disease Mother    Healthy Father    Healthy Maternal Grandmother    Heart failure Maternal Grandfather    Diabetes Maternal Grandfather    Kidney disease Maternal Grandfather    Healthy Paternal Grandmother    Healthy Paternal Grandfather     Social History   Tobacco Use   Smoking status: Former    Types: E-cigarettes, Gaffer exposure: Past   Smokeless tobacco: Never  Vaping Use   Vaping status: Never Used  Substance Use Topics   Alcohol use: Not Currently    Alcohol/week: 5.0 standard drinks of alcohol    Types: 5 Shots of liquor per week    Comment: occassionally   Drug use: Not Currently    Types: Marijuana    Comment: Currently using MJ as of 09/21/21    ROS   Objective:   Vitals: BP 129/80 (BP Location: Left Arm)   Pulse 76   Temp 98.6 F (37 C) (Oral)   Resp 16   LMP 12/26/2022 (Exact Date)   SpO2 99%   Physical Exam Constitutional:      General: She is not in acute distress.    Appearance: Normal appearance. She is well-developed. She is not ill-appearing, toxic-appearing or diaphoretic.  HENT:     Head:  Normocephalic and atraumatic.     Nose: Nose normal.     Mouth/Throat:     Mouth: Mucous membranes are moist.  Eyes:     General: No scleral icterus.       Right eye: No discharge.        Left eye: No discharge.     Extraocular Movements: Extraocular movements intact.  Cardiovascular:     Rate and Rhythm: Normal rate.  Pulmonary:     Effort: Pulmonary effort is normal.  Skin:    General: Skin is warm and dry.  Neurological:     General: No focal deficit present.     Mental Status: She is alert and oriented to person, place, and time.  Psychiatric:        Mood and Affect: Mood normal.        Behavior: Behavior normal.     Assessment and Plan :   PDMP not reviewed this encounter.  1. Acute vaginitis   2. [redacted] weeks gestation of pregnancy    Will treat based on results given her pregnancy.  Counseled patient on potential for adverse effects with medications prescribed/recommended today, ER and return-to-clinic precautions discussed, patient verbalized understanding.    Wallis Bamberg, [place] 05/07/23 [phone]

## 2023-04-30 ENCOUNTER — Inpatient Hospital Stay (HOSPITAL_COMMUNITY)

## 2023-04-30 ENCOUNTER — Telehealth: Payer: Self-pay

## 2023-04-30 DIAGNOSIS — K5900 Constipation, unspecified: Secondary | ICD-10-CM

## 2023-04-30 DIAGNOSIS — O99612 Diseases of the digestive system complicating pregnancy, second trimester: Secondary | ICD-10-CM

## 2023-04-30 DIAGNOSIS — Z3A17 17 weeks gestation of pregnancy: Secondary | ICD-10-CM

## 2023-04-30 DIAGNOSIS — N949 Unspecified condition associated with female genital organs and menstrual cycle: Secondary | ICD-10-CM

## 2023-04-30 DIAGNOSIS — D72829 Elevated white blood cell count, unspecified: Secondary | ICD-10-CM

## 2023-04-30 LAB — GC/CHLAMYDIA PROBE AMP (~~LOC~~) NOT AT ARMC
Chlamydia: NEGATIVE
Comment: NEGATIVE
Comment: NORMAL
Neisseria Gonorrhea: NEGATIVE

## 2023-04-30 LAB — CERVICOVAGINAL ANCILLARY ONLY
Bacterial Vaginitis (gardnerella): POSITIVE — AB
Candida Glabrata: NEGATIVE
Candida Vaginitis: NEGATIVE
Chlamydia: NEGATIVE
Comment: NEGATIVE
Comment: NEGATIVE
Comment: NEGATIVE
Comment: NEGATIVE
Comment: NEGATIVE
Comment: NORMAL
Neisseria Gonorrhea: NEGATIVE
Trichomonas: NEGATIVE

## 2023-04-30 MED ORDER — ACETAMINOPHEN 500 MG PO TABS: 1000.0000 mg | ORAL_TABLET | Freq: Three times a day (TID) | ORAL | 1 refills | Status: DC | PRN

## 2023-04-30 MED ORDER — CYCLOBENZAPRINE HCL 10 MG PO TABS: 10.0000 mg | ORAL_TABLET | Freq: Three times a day (TID) | ORAL | 2 refills | Status: DC | PRN

## 2023-04-30 MED ORDER — POLYETHYLENE GLYCOL 3350 17 GM/SCOOP PO POWD: 17.0000 g | Freq: Every day | ORAL | 0 refills | Status: DC | PRN

## 2023-04-30 MED ORDER — SENNA 8.6 MG PO TABS: 2.0000 | ORAL_TABLET | Freq: Every day | ORAL | 0 refills | Status: DC | PRN

## 2023-04-30 NOTE — Telephone Encounter (Signed)
 Following up with patient who called the ACCESSNURSE line over the weekend. She was evaluated at L&D, she reports that she is feeling better today.

## 2023-05-01 ENCOUNTER — Telehealth (HOSPITAL_COMMUNITY): Payer: Self-pay

## 2023-05-01 MED ORDER — METRONIDAZOLE 0.75 % VA GEL: 1.0000 | Freq: Every day | VAGINAL | 0 refills | Status: AC

## 2023-05-01 NOTE — Telephone Encounter (Signed)
 Per protocol, pt requires tx with metrogel. Rx sent to pharmacy on file.

## 2023-05-17 ENCOUNTER — Encounter: Payer: Self-pay | Admitting: Obstetrics

## 2023-05-17 ENCOUNTER — Ambulatory Visit (INDEPENDENT_AMBULATORY_CARE_PROVIDER_SITE_OTHER): Admitting: Obstetrics

## 2023-05-17 ENCOUNTER — Ambulatory Visit

## 2023-05-17 VITALS — BP 134/83 | HR 90 | Wt 162.0 lb

## 2023-05-17 DIAGNOSIS — Z3490 Encounter for supervision of normal pregnancy, unspecified, unspecified trimester: Secondary | ICD-10-CM | POA: Diagnosis not present

## 2023-05-17 DIAGNOSIS — Z3A20 20 weeks gestation of pregnancy: Secondary | ICD-10-CM | POA: Diagnosis not present

## 2023-05-17 DIAGNOSIS — Z363 Encounter for antenatal screening for malformations: Secondary | ICD-10-CM | POA: Diagnosis not present

## 2023-05-17 DIAGNOSIS — Z3402 Encounter for supervision of normal first pregnancy, second trimester: Secondary | ICD-10-CM

## 2023-05-17 DIAGNOSIS — E663 Overweight: Secondary | ICD-10-CM

## 2023-05-17 DIAGNOSIS — Z362 Encounter for other antenatal screening follow-up: Secondary | ICD-10-CM | POA: Diagnosis not present

## 2023-05-17 NOTE — Assessment & Plan Note (Signed)
-  [place] today incomplete. Repeat US ordered -Plans to transfer care to [place] -Discussed anticipatory guidance and fetal development

## 2023-05-17 NOTE — Progress Notes (Signed)
    Return Prenatal Note   Assessment/Plan   Plan  23 y.o. G1P0000 at [redacted]w[redacted]d presents for follow-up OB visit. Reviewed prenatal record including previous visit note.  Supervision of normal pregnancy -[place] today incomplete. Repeat US ordered -Plans to transfer care to [place] -Discussed anticipatory guidance and fetal development   Orders Placed This Encounter  Procedures   US OB Follow Up    Standing Status:   Future    Expected Date:   06/16/2023    Expiration Date:   05/16/2024    Reason for Exam (SYMPTOM  OR DIAGNOSIS REQUIRED):   incomplete anatomy scan    Preferred Imaging Location?:   Internal   Return in about 4 weeks (around 06/14/2023).   Future Appointments  Date Time Provider Department Center  06/14/2023 10:15 AM [facility] [place] 1 [facility] None  06/14/2023  1:15 PM Doreene Burke, CNM [facility] None    For next visit:  Routine prenatal care    Subjective   Norma Deleon is feeling well. She is doing stretches. She strongly desires a waterbirth and has completed the class. She plans to transfer to [place].  Movement: Present Contractions: Not present  Objective   Flow sheet Vitals: Pulse Rate: 90 BP: 134/83 Fetal Heart Rate (bpm): 158 Total weight gain: 2 lb (0.907 kg)  General Appearance  No acute distress, well appearing, and well nourished Pulmonary   Normal work of breathing Neurologic   Alert and oriented to person, place, and time Psychiatric   Mood and affect within normal limits  Guadlupe Spanish, CNM 05/17/23 11:07 AM

## 2023-06-12 ENCOUNTER — Encounter: Payer: Self-pay | Admitting: Obstetrics & Gynecology

## 2023-06-12 ENCOUNTER — Ambulatory Visit: Admitting: Obstetrics & Gynecology

## 2023-06-12 VITALS — BP 126/77 | HR 80 | Wt 174.0 lb

## 2023-06-12 DIAGNOSIS — R8271 Bacteriuria: Secondary | ICD-10-CM | POA: Diagnosis not present

## 2023-06-12 DIAGNOSIS — O99891 Other specified diseases and conditions complicating pregnancy: Secondary | ICD-10-CM | POA: Diagnosis not present

## 2023-06-12 DIAGNOSIS — O26892 Other specified pregnancy related conditions, second trimester: Secondary | ICD-10-CM | POA: Diagnosis not present

## 2023-06-12 DIAGNOSIS — Z1339 Encounter for screening examination for other mental health and behavioral disorders: Secondary | ICD-10-CM

## 2023-06-12 DIAGNOSIS — Z3A24 24 weeks gestation of pregnancy: Secondary | ICD-10-CM

## 2023-06-12 DIAGNOSIS — M549 Dorsalgia, unspecified: Secondary | ICD-10-CM

## 2023-06-12 DIAGNOSIS — O26899 Other specified pregnancy related conditions, unspecified trimester: Secondary | ICD-10-CM

## 2023-06-12 DIAGNOSIS — Z3402 Encounter for supervision of normal first pregnancy, second trimester: Secondary | ICD-10-CM

## 2023-06-12 DIAGNOSIS — R102 Pelvic and perineal pain: Secondary | ICD-10-CM

## 2023-06-12 NOTE — Patient Instructions (Signed)
 Oral Glucose Tolerance Test During Pregnancy Why am I having this test? The oral glucose tolerance test (GTT) is done to check how your body processes blood sugar (glucose). This is one of several tests used to diagnose diabetes that develops during pregnancy (gestational diabetes mellitus). Gestational diabetes is a short-term form of diabetes that some women develop while they are pregnant. It usually occurs during the second or third trimester of pregnancy and goes away after delivery. Testing, or screening, for gestational diabetes usually occurs around 63 of pregnancy. This test may also be needed earlier if: You have a history of gestational diabetes. There is a history of giving birth to very large babies or of losing pregnancies (having stillbirths). You have signs and symptoms of diabetes, such as: Changes in your eyesight. Tingling or numbness in your hands or feet. Changes in hunger, thirst, and urination, and these are not explained by your pregnancy. What is being tested? This test measures the amount of glucose in your blood at different times during a period of 2 hours. This shows how well your body can process glucose.  You will have three separate blood draws. What kind of sample is taken?  Blood samples are required for this test. They are usually collected by inserting a needle into a blood vessel. How do I prepare for this test? For 3 days before your test, eat normally. Have plenty of carbohydrate-rich foods. You will be asked not to eat or drink anything other than water (to fast) starting 8-10 hours before the test. Tell a health care provider about: All medicines you are taking, including vitamins, herbs, eye drops, creams, and over-the-counter medicines. Any blood disorders you have. Any surgeries you have had. Any medical conditions you have. What happens during the test? First, your blood glucose will be measured. This is referred to as your fasting blood glucose  because you fasted before the test. Then, you will drink a glucose solution that contains a certain amount of glucose. Your blood glucose will be measured again 1 and 2 hours after you drink the solution. This test takes about 2 hours to complete. You will need to stay at the testing location during this time. During the testing period: Do not eat or drink anything other than the glucose solution. Do not exercise. Do not use any products that contain nicotine  or tobacco, such as cigarettes, e-cigarettes, and chewing tobacco. These can affect your test results. If you need help quitting, ask your health care provider. The testing procedure may vary among health care providers and hospitals. How are the results reported? Your results will be reported as milligrams of glucose per deciliter of blood (mg/dL) or millimoles per liter (mmol/L). There is more than one source for screening and diagnosis reference values used to diagnose gestational diabetes. Your health care provider will compare your results to normal values that were established after testing a large group of people (reference values). Reference values may vary among labs and hospitals. For this test, reference values are: Fasting: 92 mg/dL 1 hour: 161 mg/dL  2 hour: 096 mg/dL   What do the results mean? Results below the reference values are considered normal. If one or more of your blood glucose levels are at or above the reference values, you will be diagnosed with gestational diabetes.  Talk with your health care provider about what your results mean. Questions to ask your health care provider Ask your health care provider, or the department that is doing the test: When  will my results be ready? How will I get my results? What are my treatment options? What other tests do I need? What are my next steps? Summary The oral glucose tolerance test (GTT) is one of several tests used to diagnose diabetes that develops during pregnancy  (gestational diabetes mellitus). Gestational diabetes is a short-term form of diabetes that some women develop while they are pregnant. You may also have this test if you have any symptoms or risk factors for this type of diabetes. Talk with your health care provider about what your results mean. This information is not intended to replace advice given to you by your health care provider. Make sure you discuss any questions you have with your health care provider.   TDaP Vaccine Pregnancy Get the Whooping Cough Vaccine While You Are Pregnant (CDC)  It is important for women to get the whooping cough vaccine in the third trimester of each pregnancy. Vaccines are the best way to prevent this disease. There are 2 different whooping cough vaccines. Both vaccines combine protection against whooping cough, tetanus and diphtheria, but they are for different age groups: Tdap: for everyone 11 years or older, including pregnant women  DTaP: for children 2 months through 76 years of age  You need the whooping cough vaccine during each of your pregnancies The recommended time to get the shot is during your 27th through 36th week of pregnancy, preferably during the earlier part of this time period. The Centers for Disease Control and Prevention (CDC) recommends that pregnant women receive the whooping cough vaccine for adolescents and adults (called Tdap vaccine) during the third trimester of each pregnancy. The recommended time to get the shot is during your 27th through 36th week of pregnancy, preferably during the earlier part of this time period. This replaces the original recommendation that pregnant women get the vaccine only if they had not previously received it. The [employer] of [employer]stetricians and Gynecologists and the [employer] support this recommendation.  You should get the whooping cough vaccine while pregnant to pass protection to your baby frame support disabled  and/or not supported in this browser  Learn why Rice Chamorro decided to get the whooping cough vaccine in her 3rd trimester of pregnancy and how her baby girl was born with some protection against the disease. Also available on YouTube. After receiving the whooping cough vaccine, your body will create protective antibodies (proteins produced by the body to fight off diseases) and pass some of them to your baby before birth. These antibodies provide your baby some short-term protection against whooping cough in early life. These antibodies can also protect your baby from some of the more serious complications that come along with whooping cough. Your protective antibodies are at their highest about 2 weeks after getting the vaccine, but it takes time to pass them to your baby. So the preferred time to get the whooping cough vaccine is early in your third trimester. The amount of whooping cough antibodies in your body decreases over time. That is why CDC recommends you get a whooping cough vaccine during each pregnancy. Doing so allows each of your babies to get the greatest number of protective antibodies from you. This means each of your babies will get the best protection possible against this disease.  Getting the whooping cough vaccine while pregnant is better than getting the vaccine after you give birth Whooping cough vaccination during pregnancy is ideal so your baby will have short-term protection as soon as  he is born. This early protection is important because your baby will not start getting his whooping cough vaccines until he is 2 months old. These first few months of life are when your baby is at greatest risk for catching whooping cough. This is also when he's at greatest risk for having severe, potentially life-threating complications from the infection. To avoid that gap in protection, it is best to get a whooping cough vaccine during pregnancy. You will then pass protection to your baby before he  is born. To continue protecting your baby, he should get whooping cough vaccines starting at 2 months old. You may never have gotten the Tdap vaccine before and did not get it during this pregnancy. If so, you should make sure to get the vaccine immediately after you give birth, before leaving the hospital or birthing center. It will take about 2 weeks before your body develops protection (antibodies) in response to the vaccine. Once you have protection from the vaccine, you are less likely to give whooping cough to your newborn while caring for him. But remember, your baby will still be at risk for catching whooping cough from others. A recent study looked to see how effective Tdap was at preventing whooping cough in babies whose mothers got the vaccine while pregnant or in the hospital after giving birth. The study found that getting Tdap between 27 through 36 weeks of pregnancy is 85% more effective at preventing whooping cough in babies younger than 2 months old. Blood tests cannot tell if you need a whooping cough vaccine There are no blood tests that can tell you if you have enough antibodies in your body to protect yourself or your baby against whooping cough. Even if you have been sick with whooping cough in the past or previously received the vaccine, you still should get the vaccine during each pregnancy. Breastfeeding may pass some protective antibodies onto your baby By breastfeeding, you may pass some antibodies you have made in response to the vaccine to your baby. When you get a whooping cough vaccine during your pregnancy, you will have antibodies in your breast milk that you can share with your baby as soon as your milk comes in. However, your baby will not get protective antibodies immediately if you wait to get the whooping cough vaccine until after delivering your baby. This is because it takes about 2 weeks for your body to create antibodies. Learn more about the health benefits of  breastfeeding.    Considering Waterbirth? Guide for patients at [facility]) Why consider waterbirth? Gentle birth for babies  Less pain medicine used in labor  May allow for passive descent/less pushing  May reduce perineal tears  More mobility and instinctive maternal position changes  Increased maternal relaxation   Is waterbirth safe? What are the risks of infection, drowning or other complications? Infection:  Very low risk (3.7 % for tub vs 4.8% for bed)  7 in 8000 waterbirths with documented infection  Poorly cleaned equipment most common cause  Slightly lower group B strep transmission rate  Drowning  Maternal:  Very low risk  Related to seizures or fainting  Newborn:  Very low risk. No evidence of increased risk of respiratory problems in multiple large studies  Physiological protection from breathing under water  Avoid underwater birth if there are any fetal complications  Once baby's head is out of the water, keep it out.  Birth complication  Some reports of cord trauma, but risk  decreased by bringing baby to surface gradually  No evidence of increased risk of shoulder dystocia. Mothers can usually change positions faster in water than in a bed, possibly aiding the maneuvers to free the shoulder.   There are 2 things you MUST do to have a waterbirth with [facility]: Attend a waterbirth class at [facility] at [facility]   3rd Wednesday of every month from 7-9 pm (virtual during COVID) [employer] at [email or web address] or [email or web address] or by calling [phone] Bring us  the certificate from the class to your prenatal appointment or send via MyChart Meet with a midwife at 36 weeks* to see if you can still plan a waterbirth and to sign the consent.   *We also recommend that you schedule as many of your prenatal visits with a midwife as possible.    Helpful information: You may want to bring a bathing suit  top to the hospital to wear during labor but this is optional.  All other supplies are provided by the hospital. Please arrive at the hospital with signs of active labor, and do not wait at home until late in labor. It takes 45 min- 1 hour for fetal monitoring, and check in to your room to take place, plus transport and filling of the waterbirth tub.    Things that would prevent you from having a waterbirth: Premature, <37wks  Previous cesarean birth  Presence of thick meconium-stained fluid  Multiple gestation (Twins, triplets, etc.)  Uncontrolled diabetes or gestational diabetes requiring medication  Hypertension diagnosed in pregnancy or preexisting hypertension (gestational hypertension, preeclampsia, or chronic hypertension) Fetal growth restriction (your baby measures less than 10th percentile on ultrasound) Heavy vaginal bleeding  Non-reassuring fetal heart rate  Active infection (MRSA, etc.). Group B Strep is NOT a contraindication for waterbirth.  If your labor has to be induced and induction method requires continuous monitoring of the baby's heart rate  Other risks/issues identified by your obstetrical provider   Please remember that birth is unpredictable. Under certain unforeseeable circumstances your provider may advise against giving birth in the tub. These decisions will be made on a case-by-case basis and with the safety of you and your baby as our highest priority.    Updated 05/18/21

## 2023-06-12 NOTE — Progress Notes (Signed)
 Transfer of OB.  C: Notes back pain and pelvic pain  Pt wants to discuss Pelvic floor therapy notes doing stretching at home.

## 2023-06-12 NOTE — Progress Notes (Signed)
   PRENATAL VISIT NOTE  Subjective:  Norma Deleon is a 23 y.o. G1P0000 at [redacted]w[redacted]d being seen today for transfer of prenatal care from [facility]. She is interested in waterbirth, has taken the class.   She is currently monitored for the following issues for this low-risk pregnancy and has Overweight BMI=28.7; Supervision of normal pregnancy; and Group B streptococcal bacteriuria on their problem list.  Patient reports  having acne on her back, wants to know what she can use .  Also wants referral for pelvic floor therapy due to pelvic and back pain in pregnancy.  Contractions: Not present. Vag. Bleeding: None.  Movement: Present. Denies leaking of fluid.   The following portions of the patient's history were reviewed and updated as appropriate: allergies, current medications, past family history, past medical history, past social history, past surgical history and problem list.   Objective:   Vitals:   06/12/23 0949  BP: 126/77  Pulse: 80  Weight: 174 lb (78.9 kg)    Fetal Status: Fetal Heart Rate (bpm): 156   Movement: Present     General:  Alert, oriented and cooperative. Patient is in no acute distress.  Cardiovascular: Normal heart rate noted  Respiratory: Normal respiratory effort, no problems with respiration noted  Abdomen: Soft, gravid, appropriate for gestational age.  Pain/Pressure: Present     Pelvic: Cervical exam deferred        Extremities: Normal range of motion.  Edema: None  Mental Status: Normal mood and affect. Normal behavior. Normal judgment and thought content.   Assessment and Plan:  Pregnancy: G1P0000 at [redacted]w[redacted]d 1. Pelvic pain in pregnancy 2. Back pain in pregnancy Referred for physical therapy.  Recommended Tylenol  and Flexeril  as needed, support garments. - Ambulatory referral to Physical Therapy  3. [redacted] weeks gestation of pregnancy 4. Encounter for supervision of normal first pregnancy in second trimester (Primary) Discussed need for third trimester  labs next visit, also counseled about Tdap. Information about appropriate OTC remedies for acne discussed. Discussed waterbirth, information givne to her, she will discuss further with CNM next visit. The nature of  [facility] with multiple MDs and other Advanced Practice Providers was explained to patient; also emphasized that residents, students are part of our team. Preterm labor symptoms and general obstetric precautions including but not limited to vaginal bleeding, contractions, leaking of fluid and fetal movement were reviewed in detail with the patient. Please refer to After Visit Summary for other counseling recommendations.   Return in 4 weeks (on 07/10/2023) for 2 hr GTT, 3rd trimester labs, TDap, OFFICE OB VISIT (CNM to discuss waterbirth).  Future Appointments  Date Time Provider Department Center  07/10/2023  8:15 AM [facility]  07/10/2023  8:35 AM Yeva Bissette, Kathrine Paris, MD [facility]  07/25/2023 10:55 AM Tari Fare, CNM [facility]  08/08/2023 10:35 AM Tari Fare, CNM [facility]    Lenoard Rad, MD

## 2023-06-14 ENCOUNTER — Other Ambulatory Visit

## 2023-06-14 ENCOUNTER — Encounter: Admitting: Certified Nurse Midwife

## 2023-06-15 ENCOUNTER — Other Ambulatory Visit: Payer: Self-pay

## 2023-06-15 DIAGNOSIS — Z3402 Encounter for supervision of normal first pregnancy, second trimester: Secondary | ICD-10-CM

## 2023-06-15 MED ORDER — PRENATAL 28-0.8 MG PO TABS
1.0000 | ORAL_TABLET | Freq: Every day | ORAL | 12 refills | Status: DC
Start: 2023-06-15 — End: 2023-12-20

## 2023-06-15 NOTE — Progress Notes (Signed)
 Pt called requesting Rx for PNVs  Rx sent.

## 2023-06-19 ENCOUNTER — Other Ambulatory Visit: Payer: Self-pay | Admitting: Family Medicine

## 2023-06-19 DIAGNOSIS — Z3402 Encounter for supervision of normal first pregnancy, second trimester: Secondary | ICD-10-CM

## 2023-06-21 ENCOUNTER — Inpatient Hospital Stay (HOSPITAL_COMMUNITY)
Admission: AD | Admit: 2023-06-21 | Discharge: 2023-06-21 | Disposition: A | Discharge status: Home / Self Care | Attending: Obstetrics and Gynecology | Admitting: Obstetrics and Gynecology

## 2023-06-21 DIAGNOSIS — Z3A25 25 weeks gestation of pregnancy: Secondary | ICD-10-CM | POA: Insufficient documentation

## 2023-06-21 DIAGNOSIS — Z711 Person with feared health complaint in whom no diagnosis is made: Secondary | ICD-10-CM

## 2023-06-21 DIAGNOSIS — E860 Dehydration: Secondary | ICD-10-CM | POA: Diagnosis not present

## 2023-06-21 DIAGNOSIS — O99282 Endocrine, nutritional and metabolic diseases complicating pregnancy, second trimester: Secondary | ICD-10-CM | POA: Diagnosis present

## 2023-06-21 DIAGNOSIS — O26892 Other specified pregnancy related conditions, second trimester: Secondary | ICD-10-CM | POA: Diagnosis not present

## 2023-06-21 DIAGNOSIS — R55 Syncope and collapse: Secondary | ICD-10-CM

## 2023-06-21 LAB — URINALYSIS, ROUTINE W REFLEX MICROSCOPIC
Bilirubin Urine: NEGATIVE
Glucose, UA: NEGATIVE mg/dL
Hgb urine dipstick: NEGATIVE
Ketones, ur: NEGATIVE mg/dL
Leukocytes,Ua: NEGATIVE
Nitrite: NEGATIVE
Protein, ur: NEGATIVE mg/dL
Specific Gravity, Urine: 1.003 — ABNORMAL LOW (ref 1.005–1.030)
pH: 7 (ref 5.0–8.0)

## 2023-06-21 NOTE — MAU Note (Addendum)
 Norma Deleon is a 23 y.o. at [redacted]w[redacted]d here in MAU reporting: almost passed out today while she was on a walk.   Thinks she may be dehydrated though she has been trying to drink.  Was wondering if she could get some fluids or her levels checked. Denies vomiting.  Able to eat and drink.  Has been feeling hot.  Mucous membranes are pink and moist.  Denies bleeding or leaking.  Reports +FM. Onset of complaint: 1100 Pain score: none Vitals:   06/21/23 1724 06/21/23 1725  Pulse:  88  Resp:  16  Temp:  98.6 F (37 C)  SpO2: 99% 100%     FHT:148 Lab orders placed from triage:  urine   No dizziness while checking orthostatic vs

## 2023-06-21 NOTE — Progress Notes (Signed)
 Atha Lazar CNM in to talk with pt and discuss d/c plan. Written and verbal d/c instructions given and pt voiced understanding. Pt d/c home by provider

## 2023-06-21 NOTE — MAU Provider Note (Signed)
 History     CSN: [id number]  Arrival date and time: 06/21/23 1643   Event Date/Time   First Provider Initiated Contact with Patient 06/21/23 1744      Chief Complaint  Patient presents with   possible dehydration   Norma Deleon , a  23 y.o. G1P0000 at [redacted]w[redacted]d presents to MAU with complaints of "about to pass out" during a walk. Patient states that she was out walking around 11/12 and reports feeling lightheaded and dizzy. Patient states that she felt like she was "too hot" and had to sit down in the shade. She reports eating only a bowl a cereal prior to this incident and denies drinking any water. Reports ~80 degrees at time of incident. Denies any trauma, palpitations, or difficulty breathing outside of normal pregnancy discomfort. Patient states that since that incident she has eaten chick fila and overall feels better. She denies vaginal bleeding, leaking of fluid or contractions. Endorses positive fetal movement.          OB History     Gravida  1   Para  0   Term  0   Preterm  0   AB  0   Living  0      SAB  0   IAB  0   Ectopic  0   Multiple  0   Live Births  0           No past medical history on file.  Past Surgical History:  Procedure Laterality Date   NO PAST SURGERIES      Family History  Problem Relation Age of Onset   Diabetes Mother    Heart disease Mother    Healthy Father    Healthy Maternal Grandmother    Heart failure Maternal Grandfather    Diabetes Maternal Grandfather    Kidney disease Maternal Grandfather    Healthy Paternal Grandmother    Healthy Paternal Grandfather     Social History   Tobacco Use   Smoking status: Former    Types: E-cigarettes, Gaffer exposure: Past   Smokeless tobacco: Never  Vaping Use   Vaping status: Never Used  Substance Use Topics   Alcohol use: Not Currently    Alcohol/week: 5.0 standard drinks of alcohol    Types: 5 Shots of liquor per week    Comment: occassionally    Drug use: Not Currently    Types: Marijuana    Comment: January 2025    Allergies: No Known Allergies  Medications Prior to Admission  Medication Sig Dispense Refill Last Dose/Taking   acetaminophen  (TYLENOL ) 500 MG tablet Take 2 tablets (1,000 mg total) by mouth every 8 (eight) hours as needed for mild pain (pain score 1-3). (Patient not taking: Reported on 06/12/2023) 100 tablet 1    cyclobenzaprine  (FLEXERIL ) 10 MG tablet Take 1 tablet (10 mg total) by mouth 3 (three) times daily as needed for muscle spasms. (Patient not taking: Reported on 06/12/2023) 30 tablet 2    polyethylene glycol powder (GLYCOLAX /MIRALAX ) 17 GM/SCOOP powder Take 17 g by mouth daily as needed for moderate constipation. (Patient not taking: Reported on 06/12/2023) 255 g 0    Prenatal 28-0.8 MG TABS Take 1 tablet by mouth daily. 30 tablet 12    Prenatal Vit-Fe Fumarate-FA (PRENATAL VITAMINS) 27-0.8 MG TABS Take 1 capsule by mouth daily.      senna (SENOKOT) 8.6 MG TABS tablet Take 2 tablets (17.2 mg total) by mouth daily as needed  for mild constipation. 120 tablet 0     Review of Systems  Constitutional:  Negative for chills, fatigue and fever.  Eyes:  Negative for pain and visual disturbance.  Respiratory:  Negative for apnea, shortness of breath and wheezing.   Cardiovascular:  Negative for chest pain and palpitations.  Gastrointestinal:  Negative for abdominal pain, constipation, diarrhea, nausea and vomiting.  Genitourinary:  Negative for difficulty urinating, dysuria, pelvic pain, vaginal bleeding, vaginal discharge and vaginal pain.  Musculoskeletal:  Negative for back pain.  Neurological:  Positive for dizziness, weakness and light-headedness. Negative for seizures and headaches.  Psychiatric/Behavioral:  Negative for suicidal ideas.    Physical Exam   Pulse 88, temperature 98.6 F (37 C), temperature source Oral, resp. rate 16, height 5\' 5"  (1.651 m), weight 80.5 kg, last menstrual period 12/26/2022, SpO2  100%.  Physical Exam Vitals and nursing note reviewed.  Constitutional:      General: She is not in acute distress.    Appearance: Normal appearance.  HENT:     Head: Normocephalic.  Cardiovascular:     Rate and Rhythm: Normal rate and regular rhythm.  Pulmonary:     Effort: Pulmonary effort is normal.     Breath sounds: Normal breath sounds.  Abdominal:     Comments: Gravid uterus   Musculoskeletal:     Cervical back: Normal range of motion.  Skin:    General: Skin is warm and dry.     Capillary Refill: Capillary refill takes 2 to 3 seconds.  Neurological:     Mental Status: She is alert and oriented to person, place, and time.  Psychiatric:        Mood and Affect: Mood normal.    FHT obtained in triage   MAU Course  Procedures Orders Placed This Encounter  Procedures   Urinalysis, Routine w reflex microscopic -Urine, Clean Catch   Discharge patient Discharge disposition: 01-Home or Self Care; Discharge patient date: 06/21/2023   Results for orders placed or performed during the hospital encounter of 06/21/23 (from the past 24 hours)  Urinalysis, Routine w reflex microscopic -Urine, Clean Catch     Status: Abnormal   Collection Time: 06/21/23  5:55 PM  Result Value Ref Range   Color, Urine STRAW (A) YELLOW   APPearance CLEAR CLEAR   Specific Gravity, Urine 1.003 (L) 1.005 - 1.030   pH 7.0 5.0 - 8.0   Glucose, UA NEGATIVE NEGATIVE mg/dL   Hgb urine dipstick NEGATIVE NEGATIVE   Bilirubin Urine NEGATIVE NEGATIVE   Ketones, ur NEGATIVE NEGATIVE mg/dL   Protein, ur NEGATIVE NEGATIVE mg/dL   Nitrite NEGATIVE NEGATIVE   Leukocytes,Ua NEGATIVE NEGATIVE    MDM - Very mild dehydration noted on UA.  - VS stable  - Plan for discharge   Assessment and Plan   1. Vasovagal episode   2. [redacted] weeks gestation of pregnancy   3. Physically well but worried   4. Mild dehydration    - Recommended to eat a high protein and good carb rich diet prior to exercise.  - Encouraged  to at least double water intake.  - Reviewed worsening signs and return precautions.  - Patient has appt on 5/27. Encouraged to keep appt.  - Patient discharged home in stable condition and may return to MAU as needed.   Corie Diamond, MSN CNM  06/21/2023, 5:44 PM

## 2023-06-22 ENCOUNTER — Ambulatory Visit: Attending: Obstetrics & Gynecology

## 2023-06-22 ENCOUNTER — Other Ambulatory Visit: Payer: Self-pay

## 2023-06-22 DIAGNOSIS — M549 Dorsalgia, unspecified: Secondary | ICD-10-CM | POA: Insufficient documentation

## 2023-06-22 DIAGNOSIS — M6281 Muscle weakness (generalized): Secondary | ICD-10-CM | POA: Diagnosis present

## 2023-06-22 DIAGNOSIS — R262 Difficulty in walking, not elsewhere classified: Secondary | ICD-10-CM | POA: Diagnosis present

## 2023-06-22 DIAGNOSIS — R252 Cramp and spasm: Secondary | ICD-10-CM | POA: Insufficient documentation

## 2023-06-22 DIAGNOSIS — O99891 Other specified diseases and conditions complicating pregnancy: Secondary | ICD-10-CM | POA: Insufficient documentation

## 2023-06-22 DIAGNOSIS — R293 Abnormal posture: Secondary | ICD-10-CM | POA: Insufficient documentation

## 2023-06-22 DIAGNOSIS — M5459 Other low back pain: Secondary | ICD-10-CM | POA: Insufficient documentation

## 2023-06-22 DIAGNOSIS — R102 Pelvic and perineal pain: Secondary | ICD-10-CM | POA: Diagnosis not present

## 2023-06-22 DIAGNOSIS — O26899 Other specified pregnancy related conditions, unspecified trimester: Secondary | ICD-10-CM | POA: Insufficient documentation

## 2023-06-22 NOTE — Therapy (Unsigned)
 OUTPATIENT PHYSICAL THERAPY THORACOLUMBAR EVALUATION   Patient Name: Norma Deleon MRN: [id number] DOB:Sep 16, 2000, 23 y.o., female Today's Date: 06/22/2023  END OF SESSION:  PT End of Session - 06/22/23 1018     Visit Number 1    Date for PT Re-Evaluation 08/17/23    Authorization Type Medicaid  [place] [employer]    Progress Note Due on Visit 10    PT Start Time 1018    PT Stop Time 1100    PT Time Calculation (min) 42 min    Activity Tolerance Patient tolerated treatment well    Behavior During Therapy WFL for tasks assessed/performed             History reviewed. No pertinent past medical history. Past Surgical History:  Procedure Laterality Date   NO PAST SURGERIES     Patient Active Problem List   Diagnosis Date Noted   Group B streptococcal bacteriuria 06/12/2023   Supervision of normal pregnancy 03/05/2023   Overweight BMI=28.7 04/19/2021    PCP: PCP - General   REFERRING PROVIDER: Julianne Octave, MD  REFERRING DIAG: O99.891,M54.9 (ICD-10-CM) - Back pain in pregnancy O26.899,R10.2 (ICD-10-CM) - Pelvic pain in pregnancy  Rationale for Evaluation and Treatment: Rehabilitation  THERAPY DIAG:  Other low back pain  Muscle weakness (generalized)  Difficulty in walking, not elsewhere classified  Cramp and spasm  Abnormal posture  ONSET DATE: 06/12/2023  SUBJECTIVE:                                                                                                                                                                                           SUBJECTIVE STATEMENT: Patient is [redacted] weeks gestation and having low back and bilat  PERTINENT HISTORY:  na  PAIN:  Are you having pain? Yes: NPRS scale: 0 currently and 7/10 Pain location: low back , hips and pelvic floor Pain description: aching Aggravating factors: sleeping, standing for prolonged periods of time Relieving factors: stretching, walking and typically a hot bath but has not been able to  do this due to pregnancy  PRECAUTIONS: Other: pregnancy  RED FLAGS: None   WEIGHT BEARING RESTRICTIONS: No  FALLS:  Has patient fallen in last 6 months? No  LIVING ENVIRONMENT: Lives with: lives with their family Lives in: House/apartment   OCCUPATION: ask next visit  PLOF: Independent, Independent with basic ADLs, Independent with household mobility without device, Independent with community mobility without device, Independent with gait, and Independent with transfers  PATIENT GOALS: to get some relief of the pain so that I can continue a normal pregnancy and not be on bed rest  NEXT MD VISIT:  prn  OBJECTIVE:  Note: Objective measures were completed at Evaluation unless otherwise noted.  DIAGNOSTIC FINDINGS:  na  PATIENT SURVEYS:  Modified Oswestry 17/50=34%   COGNITION: Overall cognitive status: Within functional limits for tasks assessed     SENSATION: WFL  MUSCLE LENGTH: Hamstrings: Right 55 deg; Left 50 deg Thomas test: Right neg Left neg  POSTURE: increased lumbar lordosis  PALPATION: Symmetry throughout, slightly tender   LUMBAR ROM:   All WNL  LOWER EXTREMITY ROM:     Hip IR and ER limited bilaterally.  All others WNL  LOWER EXTREMITY MMT:    Generally 4 to 4+/5 bilaterally  LUMBAR SPECIAL TESTS:  Straight leg raise test: Negative  FUNCTIONAL TESTS:  5 times sit to stand: 11.01 sec Timed up and go (TUG): 8.46 sec  GAIT: Distance walked: 50 feet Assistive device utilized: None Level of assistance: Complete Independence Comments: guarded antalgic  TREATMENT DATE: 06/22/23                                                                                                                                  PATIENT EDUCATION:  Education details: Initiated HEP Person educated: Patient Education method: Programmer, multimedia, Facilities manager, Verbal cues, and Handouts Education comprehension: verbalized understanding, returned demonstration, and verbal  cues required  HOME EXERCISE PROGRAM: Access Code: [email or web address] URL: [email or web address] Date: 06/22/2023 Prepared by: Aletha Anderson  Exercises - Standing Hamstring Stretch on Chair  - 1 x daily - 7 x weekly - 1 sets - 3 reps - 30 sec hold - Quadricep Stretch with Chair and Counter Support  - 1 x daily - 7 x weekly - 1 sets - 3 reps - 30 sec hold - Seated Figure 4 Piriformis Stretch  - 1 x daily - 7 x weekly - 1 sets - 3 reps - 30 sed hold  ASSESSMENT:  CLINICAL IMPRESSION: Patient is a 23 y.o. female who was seen today for physical therapy evaluation and treatment for low back pain and bilateral hip pain due to pregnancy.  She is [redacted] weeks gestation.  She presents with full lumbar ROM but tight hamstrings and hip rotation.  Her tightness is greater on the left vs right.  She has some weakness in bilateral hip extension, hip ER andhip abduction but this is mild.  She would benefit from skilled PT for LE flexibility and core stabilization along with education on posture and body mechanics.    OBJECTIVE IMPAIRMENTS: difficulty walking, decreased ROM, decreased strength, increased fascial restrictions, increased muscle spasms, impaired flexibility, postural dysfunction, and pain.   ACTIVITY LIMITATIONS: carrying, lifting, bending, standing, squatting, sleeping, stairs, transfers, bed mobility, bathing, dressing, and caring for others  PARTICIPATION LIMITATIONS: meal prep, cleaning, laundry, driving, shopping, community activity, occupation, and yard work  PERSONAL FACTORS: Fitness, Past/current experiences, and 1 comorbidity: pregnancy are also affecting patient's functional outcome.   REHAB POTENTIAL: Excellent  CLINICAL DECISION MAKING: Stable/uncomplicated  EVALUATION COMPLEXITY: Low  GOALS: Goals reviewed with patient? Yes  SHORT TERM GOALS: Target date: 07/20/2023  Pain report to be no greater than 4/10  Baseline: Goal status: INITIAL  2.  Patient will be  independent with initial HEP  Baseline:  Goal status: INITIAL   LONG TERM GOALS: Target date: 08/17/2023  Patient to report pain no greater than 2/10  Baseline:  Goal status: INITIAL  2.  Patient to be independent with advanced HEP  Baseline:  Goal status: INITIAL  3.  ODI to be 24% or less Baseline:  Goal status: INITIAL  4.  Patient to be able to stand or walk for at least 15 min without hip pain  Baseline:  Goal status: INITIAL  5.  Patient to be able to bend, stoop and squat with pain no greater than 2/10  Baseline:  Goal status: INITIAL  6.  Functional tests to improve by 2-3 seconds Baseline:  Goal status: INITIAL  PLAN:  PT FREQUENCY: 1-2x/week  PT DURATION: 8 weeks  PLANNED INTERVENTIONS: 97110-Therapeutic exercises, 97530- Therapeutic activity, [id number]- Neuromuscular re-education, 97535- Self Care, 16109- Manual therapy, [phone]- Gait training, [phone]- Aquatic Therapy, Patient/Family education, Stair training, Taping, Dry Needling, Cryotherapy, and Moist heat.  PLAN FOR NEXT SESSION: Review HEP, Nustep, begin core and pelvic floor strengthening   Rasheema Truluck B. Lyllie Cobbins, PT 06/22/23 9:00 PM [facility] [address], [address] [place], [place] [address] Phone # [phone] Fax [phone]

## 2023-06-29 ENCOUNTER — Ambulatory Visit: Admitting: Physical Therapy

## 2023-06-29 ENCOUNTER — Encounter: Payer: Self-pay | Admitting: Physical Therapy

## 2023-06-29 DIAGNOSIS — M5459 Other low back pain: Secondary | ICD-10-CM | POA: Diagnosis not present

## 2023-06-29 DIAGNOSIS — R262 Difficulty in walking, not elsewhere classified: Secondary | ICD-10-CM

## 2023-06-29 DIAGNOSIS — R252 Cramp and spasm: Secondary | ICD-10-CM

## 2023-06-29 DIAGNOSIS — M6281 Muscle weakness (generalized): Secondary | ICD-10-CM

## 2023-06-29 NOTE — Therapy (Signed)
 OUTPATIENT PHYSICAL THERAPY THORACOLUMBAR TREATMENT   Patient Name: Norma Deleon MRN: [id number] DOB:March 20, 2000, 23 y.o., female Today's Date: 06/29/2023  END OF SESSION:  PT End of Session - 06/29/23 0851     Visit Number 2    Date for PT Re-Evaluation 08/17/23    Authorization Type Medicaid  [place] [employer] Approved 6 visits-07/02/2023-7/02/20/2023-auth#[id number]    Authorization - Visit Number 1    Authorization - Number of Visits 6    Progress Note Due on Visit 10    PT Start Time [phone]    PT Stop Time 0930    PT Time Calculation (min) 43 min    Activity Tolerance Patient tolerated treatment well    Behavior During Therapy [facility] for tasks assessed/performed              History reviewed. No pertinent past medical history. Past Surgical History:  Procedure Laterality Date   NO PAST SURGERIES     Patient Active Problem List   Diagnosis Date Noted   Group B streptococcal bacteriuria 06/12/2023   Supervision of normal pregnancy 03/05/2023   Overweight BMI=28.7 04/19/2021    PCP: PCP - General   REFERRING PROVIDER: Julianne Octave, MD  REFERRING DIAG: O99.891,M54.9 (ICD-10-CM) - Back pain in pregnancy O26.899,R10.2 (ICD-10-CM) - Pelvic pain in pregnancy  Rationale for Evaluation and Treatment: Rehabilitation  THERAPY DIAG:  Other low back pain  Muscle weakness (generalized)  Difficulty in walking, not elsewhere classified  Cramp and spasm  ONSET DATE: 06/12/2023  SUBJECTIVE:                                                                                                                                                                                           SUBJECTIVE STATEMENT: My pain is worse at night - I toss and turn trying SL on both sides. I am using a body pillow.  It is the hip I lay on that hurts.    PERTINENT HISTORY:  na  PAIN:  Are you having pain? Yes: NPRS scale: 0 currently and 7/10 Pain location: low back , hips and pelvic  floor Pain description: aching Aggravating factors: sleeping, standing for prolonged periods of time Relieving factors: stretching, walking and typically a hot bath but has not been able to do this due to pregnancy  PRECAUTIONS: Other: pregnancy  RED FLAGS: None   WEIGHT BEARING RESTRICTIONS: No  FALLS:  Has patient fallen in last 6 months? No  LIVING ENVIRONMENT: Lives with: lives with their family Lives in: House/apartment   OCCUPATION: ask next visit  PLOF: Independent, Independent with basic ADLs, Independent with household mobility without  device, Independent with community mobility without device, Independent with gait, and Independent with transfers  PATIENT GOALS: to get some relief of the pain so that I can continue a normal pregnancy and not be on bed rest  NEXT MD VISIT: prn  OBJECTIVE:  Note: Objective measures were completed at Evaluation unless otherwise noted.  DIAGNOSTIC FINDINGS:  na  PATIENT SURVEYS:  Modified Oswestry 17/50=34%   COGNITION: Overall cognitive status: Within functional limits for tasks assessed     SENSATION: WFL  MUSCLE LENGTH: Hamstrings: Right 55 deg; Left 50 deg Thomas test: Right neg Left neg  POSTURE: increased lumbar lordosis  PALPATION: Symmetry throughout, slightly tender   LUMBAR ROM:   All WNL  LOWER EXTREMITY ROM:     Hip IR and ER limited bilaterally.  All others WNL  LOWER EXTREMITY MMT:    Generally 4 to 4+/5 bilaterally  LUMBAR SPECIAL TESTS:  Straight leg raise test: Negative  FUNCTIONAL TESTS:  5 times sit to stand: 11.01 sec Timed up and go (TUG): 8.46 sec  GAIT: Distance walked: 50 feet Assistive device utilized: None Level of assistance: Complete Independence Comments: guarded antalgic  TREATMENT DATE:  06/29/23 NuStep L5 x 4' Seated trunk flexion ball rollouts 5x10", flexion with SB 3x10" Seated trunk SB stretch over peanut with UE reach bil 3x10" Seated on blue physioball x 20  each: tail wag, circles, tucks/lifts Review of SL sleeping position options using pillow propping Kneeling hip flexor with OH reach to hamstring stretch bil 3x20" each LE Half kneel diagonal lift yellow ball x8 Standing diagonal chop cable pulley 5lb x8 each way Standing march with bil UE pullback yellow handled band, standing march with bil UE pullforward yellow handled band - 5x each leg each direction Supine hip IR/ER and LTR x10 each Supine posterior pelvic tilt x10 Posterior pelvic tilt with alt march to 90/90  06/22/23 Initial eval completed Initiated HEP (any treatment provided on initial visit not billed due to insurance restrictions)                                                                                                                                  PATIENT EDUCATION:  Education details: Initiated HEP Person educated: Patient Education method: Programmer, multimedia, Facilities manager, Verbal cues, and Handouts Education comprehension: verbalized understanding, returned demonstration, and verbal cues required  HOME EXERCISE PROGRAM: Access Code: [facility] URL: [email or web address] Date: 06/29/2023 Prepared by: Minor Amble Rahi Chandonnet  Exercises - Standing Hamstring Stretch on Chair  - 1 x daily - 7 x weekly - 1 sets - 3 reps - 30 sec hold - Quadricep Stretch with Chair and Counter Support  - 1 x daily - 7 x weekly - 1 sets - 3 reps - 30 sec hold - Seated Figure 4 Piriformis Stretch  - 1 x daily - 7 x weekly - 1 sets - 3 reps - 30 sed hold - Half Kneel Dynamic Hamstrings and Contralateral Hip Flexor  Stretch  - 1 x daily - 7 x weekly - 1 sets - 3 reps - 15 hold - Half Kneeling Diagonal Chops with Medicine Ball  - Opposite Forward Leg  - 1 x daily - 7 x weekly - 1 sets - 8 reps - Supine Posterior Pelvic Tilt  - 1 x daily - 7 x weekly - 1 sets - 10 reps - Supine 90/90 Alternating Heel Touches with Posterior Pelvic Tilt  - 1 x daily - 7 x weekly - 1 sets - 10  reps  ASSESSMENT:  CLINICAL IMPRESSION: Patient arrives for first time follow up.  She had great relief with stretches today and did well with initiation of core functional training.  Updated HEP for core and diagonals in half-kneel today.  Discussed SL sleeping options using various pillow props.  OBJECTIVE IMPAIRMENTS: difficulty walking, decreased ROM, decreased strength, increased fascial restrictions, increased muscle spasms, impaired flexibility, postural dysfunction, and pain.   ACTIVITY LIMITATIONS: carrying, lifting, bending, standing, squatting, sleeping, stairs, transfers, bed mobility, bathing, dressing, and caring for others  PARTICIPATION LIMITATIONS: meal prep, cleaning, laundry, driving, shopping, community activity, occupation, and yard work  PERSONAL FACTORS: Fitness, Past/current experiences, and 1 comorbidity: pregnancy are also affecting patient's functional outcome.   REHAB POTENTIAL: Excellent  CLINICAL DECISION MAKING: Stable/uncomplicated  EVALUATION COMPLEXITY: Low   GOALS: Goals reviewed with patient? Yes  SHORT TERM GOALS: Target date: 07/20/2023  Pain report to be no greater than 4/10  Baseline: Goal status: INITIAL  2.  Patient will be independent with initial HEP  Baseline:  Goal status: INITIAL   LONG TERM GOALS: Target date: 08/17/2023  Patient to report pain no greater than 2/10  Baseline:  Goal status: INITIAL  2.  Patient to be independent with advanced HEP  Baseline:  Goal status: INITIAL  3.  ODI to be 24% or less Baseline:  Goal status: INITIAL  4.  Patient to be able to stand or walk for at least 15 min without hip pain  Baseline:  Goal status: INITIAL  5.  Patient to be able to bend, stoop and squat with pain no greater than 2/10  Baseline:  Goal status: INITIAL  6.  Functional tests to improve by 2-3 seconds Baseline:  Goal status: INITIAL  PLAN:  PT FREQUENCY: 1-2x/week  PT DURATION: 8 weeks  PLANNED  INTERVENTIONS: 97110-Therapeutic exercises, 97530- Therapeutic activity, [id number]- Neuromuscular re-education, 97535- Self Care, 28413- Manual therapy, [phone]- Gait training, [phone]- Aquatic Therapy, Patient/Family education, Stair training, Taping, Dry Needling, Cryotherapy, and Moist heat.  PLAN FOR NEXT SESSION: is sleeping going any better with addition of pillow prop, Review HEP, Nustep, continue core and pelvic floor strengthening  Raynell Caller, PT 06/29/23 9:31 AM  [facility] [address] [place], [place] [address] Phone # [phone] Fax [phone]

## 2023-07-04 ENCOUNTER — Ambulatory Visit: Admitting: Physical Therapy

## 2023-07-04 ENCOUNTER — Encounter: Payer: Self-pay | Admitting: Physical Therapy

## 2023-07-04 DIAGNOSIS — R262 Difficulty in walking, not elsewhere classified: Secondary | ICD-10-CM

## 2023-07-04 DIAGNOSIS — M5459 Other low back pain: Secondary | ICD-10-CM | POA: Diagnosis not present

## 2023-07-04 DIAGNOSIS — R252 Cramp and spasm: Secondary | ICD-10-CM

## 2023-07-04 DIAGNOSIS — M6281 Muscle weakness (generalized): Secondary | ICD-10-CM

## 2023-07-04 DIAGNOSIS — R293 Abnormal posture: Secondary | ICD-10-CM

## 2023-07-04 NOTE — Therapy (Signed)
 OUTPATIENT PHYSICAL THERAPY THORACOLUMBAR TREATMENT   Patient Name: LOXLEY CIBRIAN MRN: [id number] DOB:2001-01-01, 23 y.o., female Today's Date: 07/04/2023  END OF SESSION:  PT End of Session - 07/04/23 1148     Visit Number 3    Date for PT Re-Evaluation 08/17/23    Authorization Type Medicaid  [employer] Approved 6 visits-07/02/2023-7/02/20/2023-auth#[id number]    Authorization - Visit Number 2    Progress Note Due on Visit 10    PT Start Time 1148    PT Stop Time 1228    PT Time Calculation (min) 40 min    Activity Tolerance Patient tolerated treatment well    Behavior During Therapy [facility] for tasks assessed/performed               History reviewed. No pertinent past medical history. Past Surgical History:  Procedure Laterality Date   NO PAST SURGERIES     Patient Active Problem List   Diagnosis Date Noted   Group B streptococcal bacteriuria 06/12/2023   Supervision of normal pregnancy 03/05/2023   Overweight BMI=28.7 04/19/2021    PCP: PCP - General   REFERRING PROVIDER: Julianne Octave, MD  REFERRING DIAG: O99.891,M54.9 (ICD-10-CM) - Back pain in pregnancy O26.899,R10.2 (ICD-10-CM) - Pelvic pain in pregnancy  Rationale for Evaluation and Treatment: Rehabilitation  THERAPY DIAG:  Other low back pain  Muscle weakness (generalized)  Difficulty in walking, not elsewhere classified  Cramp and spasm  Abnormal posture  ONSET DATE: 06/12/2023  SUBJECTIVE:                                                                                                                                                                                           SUBJECTIVE STATEMENT: Felt a little twinge walking in about 4/10.   PERTINENT HISTORY:  na  PAIN:  Are you having pain? Yes: NPRS scale: 0 currently and 7/10 Pain location: low back , hips and pelvic floor Pain description: aching Aggravating factors: sleeping, standing for prolonged periods of  time Relieving factors: stretching, walking and typically a hot bath but has not been able to do this due to pregnancy  PRECAUTIONS: Other: pregnancy  RED FLAGS: None   WEIGHT BEARING RESTRICTIONS: No  FALLS:  Has patient fallen in last 6 months? No  LIVING ENVIRONMENT: Lives with: lives with their family Lives in: House/apartment   OCCUPATION: ask next visit  PLOF: Independent, Independent with basic ADLs, Independent with household mobility without device, Independent with community mobility without device, Independent with gait, and Independent with transfers  PATIENT GOALS: to get some relief of the pain so that I can continue a  normal pregnancy and not be on bed rest  NEXT MD VISIT: prn  OBJECTIVE:  Note: Objective measures were completed at Evaluation unless otherwise noted.  DIAGNOSTIC FINDINGS:  na  PATIENT SURVEYS:  Modified Oswestry 17/50=34%   COGNITION: Overall cognitive status: Within functional limits for tasks assessed     SENSATION: WFL  MUSCLE LENGTH: Hamstrings: Right 55 deg; Left 50 deg Thomas test: Right neg Left neg  POSTURE: increased lumbar lordosis  PALPATION: Symmetry throughout, slightly tender   LUMBAR ROM:   All WNL  LOWER EXTREMITY ROM:     Hip IR and ER limited bilaterally.  All others WNL  LOWER EXTREMITY MMT:    Generally 4 to 4+/5 bilaterally  LUMBAR SPECIAL TESTS:  Straight leg raise test: Negative  FUNCTIONAL TESTS:  5 times sit to stand: 11.01 sec Timed up and go (TUG): 8.46 sec  GAIT: Distance walked: 50 feet Assistive device utilized: None Level of assistance: Complete Independence Comments: guarded antalgic  TREATMENT DATE:  07/04/23 Elliptical Incline 2 Resistance 1 x 5 min DN discussed while on elliptical Seated trunk flexion ball rollouts 5x10", flexion with SB 3x10" Kneeling hip flexor with OH reach to hamstring stretch bil 3x20" each LE Standing diagonal chop cable pulley 5lb x8 each  way Standing march with bil UE pullback yellow handled band, standing march with bil UE chest press yellow handled band - 5x each leg each direction Supine hip IR/ER x10 each Posterior pelvic tilt with alt march to 90/90 to fatigue. Self STM with ball to lumbar/gluteals  Manual: Trigger Point Dry Needling  Initial Treatment: Pt instructed on Dry Needling rational, procedures, and possible side effects. Pt instructed to expect mild to moderate muscle soreness later in the day and/or into the next day.  Pt instructed in methods to reduce muscle soreness. Pt instructed to continue prescribed HEP. Patient was educated on signs and symptoms of infection and other risk factors and advised to seek medical attention should they occur.  Patient verbalized understanding of these instructions and education.   Patient Verbal Consent Given: Yes Education Handout Provided: Yes Muscles Treated: L gluteals Electrical Stimulation Performed: No Treatment Response/Outcome: Utilized skilled palpation to identify bony landmarks and trigger points.  Able to illicit twitch response and muscle elongation.  Soft tissue mobilization to L gluteals following DN to further promote tissue elongation and decreased pain.  Also QL release L side.   06/29/23 NuStep L5 x 4' Seated trunk flexion ball rollouts 5x10", flexion with SB 3x10" Seated trunk SB stretch over peanut with UE reach bil 3x10" Seated on blue physioball x 20 each: tail wag, circles, tucks/lifts Review of SL sleeping position options using pillow propping Kneeling hip flexor with OH reach to hamstring stretch bil 3x20" each LE Half kneel diagonal lift yellow ball x8 Standing diagonal chop cable pulley 5lb x8 each way Standing march with bil UE pullback yellow handled band, standing march with bil UE pullforward yellow handled band - 5x each leg each direction Supine hip IR/ER and LTR x10 each Supine posterior pelvic tilt x10 Posterior pelvic tilt  with alt march to 90/90  06/22/23 Initial eval completed Initiated HEP (any treatment provided on initial visit not billed due to insurance restrictions)  PATIENT EDUCATION:  Education details: Initiated HEP Person educated: Patient Education method: Programmer, multimedia, Facilities manager, Verbal cues, and Handouts Education comprehension: verbalized understanding, returned demonstration, and verbal cues required  HOME EXERCISE PROGRAM: Access Code: [facility] URL: [email or web address] Date: 06/29/2023 Prepared by: Minor Amble Beuhring  Exercises - Standing Hamstring Stretch on Chair  - 1 x daily - 7 x weekly - 1 sets - 3 reps - 30 sec hold - Quadricep Stretch with Chair and Counter Support  - 1 x daily - 7 x weekly - 1 sets - 3 reps - 30 sec hold - Seated Figure 4 Piriformis Stretch  - 1 x daily - 7 x weekly - 1 sets - 3 reps - 30 sed hold - Half Kneel Dynamic Hamstrings and Contralateral Hip Flexor Stretch  - 1 x daily - 7 x weekly - 1 sets - 3 reps - 15 hold - Half Kneeling Diagonal Chops with Medicine Ball  - Opposite Forward Leg  - 1 x daily - 7 x weekly - 1 sets - 8 reps - Supine Posterior Pelvic Tilt  - 1 x daily - 7 x weekly - 1 sets - 10 reps - Supine 90/90 Alternating Heel Touches with Posterior Pelvic Tilt  - 1 x daily - 7 x weekly - 1 sets - 10 reps  ASSESSMENT:  CLINICAL IMPRESSION: Patient reporting intermittent pain today. No change with pain at night. Initial trial of DN with good twitch response in gluteals. Also instructed patient in self MFR using ball. Good tolerance to therex and she reports good stretch with the stretches.  OBJECTIVE IMPAIRMENTS: difficulty walking, decreased ROM, decreased strength, increased fascial restrictions, increased muscle spasms, impaired flexibility, postural dysfunction, and pain.   ACTIVITY LIMITATIONS:  carrying, lifting, bending, standing, squatting, sleeping, stairs, transfers, bed mobility, bathing, dressing, and caring for others  PARTICIPATION LIMITATIONS: meal prep, cleaning, laundry, driving, shopping, community activity, occupation, and yard work  PERSONAL FACTORS: Fitness, Past/current experiences, and 1 comorbidity: pregnancy are also affecting patient's functional outcome.   REHAB POTENTIAL: Excellent  CLINICAL DECISION MAKING: Stable/uncomplicated  EVALUATION COMPLEXITY: Low   GOALS: Goals reviewed with patient? Yes  SHORT TERM GOALS: Target date: 07/20/2023  Pain report to be no greater than 4/10  Baseline: Goal status: INITIAL  2.  Patient will be independent with initial HEP  Baseline:  Goal status: INITIAL   LONG TERM GOALS: Target date: 08/17/2023  Patient to report pain no greater than 2/10  Baseline:  Goal status: INITIAL  2.  Patient to be independent with advanced HEP  Baseline:  Goal status: INITIAL  3.  ODI to be 24% or less Baseline:  Goal status: INITIAL  4.  Patient to be able to stand or walk for at least 15 min without hip pain  Baseline:  Goal status: INITIAL  5.  Patient to be able to bend, stoop and squat with pain no greater than 2/10  Baseline:  Goal status: INITIAL  6.  Functional tests to improve by 2-3 seconds Baseline:  Goal status: INITIAL  PLAN:  PT FREQUENCY: 1-2x/week  PT DURATION: 8 weeks  PLANNED INTERVENTIONS: 97110-Therapeutic exercises, 97530- Therapeutic activity, [id number]- Neuromuscular re-education, 97535- Self Care, 09811- Manual therapy, [phone]- Gait training, [phone]- Aquatic Therapy, Patient/Family education, Stair training, Taping, Dry Needling, Cryotherapy, and Moist heat.  PLAN FOR NEXT SESSION: Review HEP, Nustep, continue core and pelvic floor strengthening  Jinx Mourning, PT 07/04/23 12:32 PM  [facility] [address] [place], [place] [address] Phone #  [phone] Fax [phone]

## 2023-07-04 NOTE — Patient Instructions (Signed)

## 2023-07-10 ENCOUNTER — Ambulatory Visit: Admitting: Obstetrics & Gynecology

## 2023-07-10 ENCOUNTER — Other Ambulatory Visit

## 2023-07-10 VITALS — BP 117/78 | HR 106 | Wt 187.2 lb

## 2023-07-10 DIAGNOSIS — Z3402 Encounter for supervision of normal first pregnancy, second trimester: Secondary | ICD-10-CM

## 2023-07-10 DIAGNOSIS — Z3403 Encounter for supervision of normal first pregnancy, third trimester: Secondary | ICD-10-CM | POA: Diagnosis not present

## 2023-07-10 DIAGNOSIS — Z3A28 28 weeks gestation of pregnancy: Secondary | ICD-10-CM | POA: Diagnosis not present

## 2023-07-10 NOTE — Progress Notes (Signed)
   PRENATAL VISIT NOTE  Subjective:  Norma Deleon is a 23 y.o. G1P0000 at [redacted]w[redacted]d being seen today for ongoing prenatal care.  She is currently monitored for the following issues for this low-risk pregnancy and has Overweight BMI=28.7; Supervision of normal pregnancy; and Group B streptococcal bacteriuria on their problem list.  Patient reports no complaints.  Contractions: Not present. Vag. Bleeding: None.  Movement: Present. Denies leaking of fluid.   The following portions of the patient's history were reviewed and updated as appropriate: allergies, current medications, past family history, past medical history, past social history, past surgical history and problem list.   Objective:    Vitals:   07/10/23 0838  BP: 117/78  Pulse: (!) 106  Weight: 187 lb 3.2 oz (84.9 kg)    Fetal Status:  Fetal Heart Rate (bpm): 152 Fundal Height: 29 cm Movement: Present    General: Alert, oriented and cooperative. Patient is in no acute distress.  Skin: Skin is warm and dry. No rash noted.   Cardiovascular: Normal heart rate noted  Respiratory: Normal respiratory effort, no problems with respiration noted  Abdomen: Soft, gravid, appropriate for gestational age.  Pain/Pressure: Absent     Pelvic: Cervical exam deferred        Extremities: Normal range of motion.  Edema: Trace  Mental Status: Normal mood and affect. Normal behavior. Normal judgment and thought content.   Assessment and Plan:  Pregnancy: G1P0000 at [redacted]w[redacted]d 1. [redacted] weeks gestation of pregnancy 2. Encounter for supervision of normal first pregnancy in third trimester (Primary) Incomplete anatomy scan on 05/17/23, rescan ordered. Third trimester labs done today, will follow up results and manage accordingly. Third trimester expectations reviewed and all questions answered. Preterm labor symptoms and general obstetric precautions including but not limited to vaginal bleeding, contractions, leaking of fluid and fetal movement were  reviewed in detail with the patient. Please refer to After Visit Summary for other counseling recommendations.   Return in about 2 weeks (around 07/24/2023) for OFFICE OB VISIT (CNM preferred) - needs to discuss waterbirth.  Future Appointments  Date Time Provider Department Center  07/11/2023 10:15 AM Aletha Anderson B, PT [facility] None  07/19/2023 11:00 AM Aletha Anderson B, PT [facility] None  07/25/2023 10:55 AM Tari Fare, CNM [facility]  07/26/2023 11:00 AM Penelope Bowie, PT [facility] None  08/02/2023 11:00 AM Penelope Bowie, PT [facility] None  08/08/2023 10:35 AM Tari Fare, CNM [facility]  08/09/2023 10:15 AM Aletha Anderson B, PT [facility] None  08/16/2023 10:15 AM Penelope Bowie, PT [facility] None  08/23/2023 10:15 AM Penelope Bowie, PT [facility] None    Lenoard Rad, MD

## 2023-07-10 NOTE — Patient Instructions (Signed)
Considering Waterbirth? Guide for patients at [facility] ([facility]) Why consider waterbirth? Gentle birth for babies  Less pain medicine used in labor  May allow for passive descent/less pushing  May reduce perineal tears  More mobility and instinctive maternal position changes  Increased maternal relaxation   Is waterbirth safe? What are the risks of infection, drowning or other complications? Infection:  Very low risk (3.7 % for tub vs 4.8% for bed)  7 in 8000 waterbirths with documented infection  Poorly cleaned equipment most common cause  Slightly lower group B strep transmission rate  Drowning  Maternal:  Very low risk  Related to seizures or fainting  Newborn:  Very low risk. No evidence of increased risk of respiratory problems in multiple large studies  Physiological protection from breathing under water  Avoid underwater birth if there are any fetal complications  Once baby's head is out of the water, keep it out.  Birth complication  Some reports of cord trauma, but risk decreased by bringing baby to surface gradually  No evidence of increased risk of shoulder dystocia. Mothers can usually change positions faster in water than in a bed, possibly aiding the maneuvers to free the shoulder.   There are 2 things you MUST do to have a waterbirth with [facility]: Attend a waterbirth class at [facility]   3rd Wednesday of every month from 7-9 pm (virtual during COVID) Free Register online at [email or web address] or [email or web address] or by calling [phone] Bring us the certificate from the class to your prenatal appointment or send via MyChart Meet with a midwife at 36 weeks* to see if you can still plan a waterbirth and to sign the consent.   *We also recommend that you schedule as many of your prenatal visits with a midwife as possible.    Helpful information: You may want to bring a bathing suit top to the hospital  to wear during labor but this is optional.  All other supplies are provided by the hospital. Please arrive at the hospital with signs of active labor, and do not wait at home until late in labor. It takes 45 min- 1 hour for fetal monitoring, and check in to your room to take place, plus transport and filling of the waterbirth tub.    Things that would prevent you from having a waterbirth: Premature, <37wks  Previous cesarean birth  Presence of thick meconium-stained fluid  Multiple gestation (Twins, triplets, etc.)  Uncontrolled diabetes or gestational diabetes requiring medication  Hypertension diagnosed in pregnancy or preexisting hypertension (gestational hypertension, preeclampsia, or chronic hypertension) Fetal growth restriction (your baby measures less than 10th percentile on ultrasound) Heavy vaginal bleeding  Non-reassuring fetal heart rate  Active infection (MRSA, etc.). Group B Strep is NOT a contraindication for waterbirth.  If your labor has to be induced and induction method requires continuous monitoring of the baby's heart rate  Other risks/issues identified by your obstetrical provider   Please remember that birth is unpredictable. Under certain unforeseeable circumstances your provider may advise against giving birth in the tub. These decisions will be made on a case-by-case basis and with the safety of you and your baby as our highest priority.    Updated 05/18/21   TDaP Vaccine Pregnancy Get the Whooping Cough Vaccine While You Are Pregnant (CDC)  It is important for women to get the whooping cough vaccine in the third trimester of each pregnancy. Vaccines are the best way to prevent this disease.   There are 2 different whooping cough vaccines. Both vaccines combine protection against whooping cough, tetanus and diphtheria, but they are for different age groups: Tdap: for everyone 11 years or older, including pregnant women  DTaP: for children 2 months through 6 years of  age  You need the whooping cough vaccine during each of your pregnancies The recommended time to get the shot is during your 27th through 36th week of pregnancy, preferably during the earlier part of this time period. The Centers for Disease Control and Prevention (CDC) recommends that pregnant women receive the whooping cough vaccine for adolescents and adults (called Tdap vaccine) during the third trimester of each pregnancy. The recommended time to get the shot is during your 27th through 36th week of pregnancy, preferably during the earlier part of this time period. This replaces the original recommendation that pregnant women get the vaccine only if they had not previously received it. The [employer] of Obstetricians and Gynecologists and the [employer] support this recommendation.  You should get the whooping cough vaccine while pregnant to pass protection to your baby frame support disabled and/or not supported in this browser  Learn why Mickel Baas decided to get the whooping cough vaccine in her 3rd trimester of pregnancy and how her baby girl was born with some protection against the disease. Also available on YouTube. After receiving the whooping cough vaccine, your body will create protective antibodies (proteins produced by the body to fight off diseases) and pass some of them to your baby before birth. These antibodies provide your baby some short-term protection against whooping cough in early life. These antibodies can also protect your baby from some of the more serious complications that come along with whooping cough. Your protective antibodies are at their highest about 2 weeks after getting the vaccine, but it takes time to pass them to your baby. So the preferred time to get the whooping cough vaccine is early in your third trimester. The amount of whooping cough antibodies in your body decreases over time. That is why CDC recommends you get a whooping cough  vaccine during each pregnancy. Doing so allows each of your babies to get the greatest number of protective antibodies from you. This means each of your babies will get the best protection possible against this disease.  Getting the whooping cough vaccine while pregnant is better than getting the vaccine after you give birth Whooping cough vaccination during pregnancy is ideal so your baby will have short-term protection as soon as he is born. This early protection is important because your baby will not start getting his whooping cough vaccines until he is 2 months old. These first few months of life are when your baby is at greatest risk for catching whooping cough. This is also when he's at greatest risk for having severe, potentially life-threating complications from the infection. To avoid that gap in protection, it is best to get a whooping cough vaccine during pregnancy. You will then pass protection to your baby before he is born. To continue protecting your baby, he should get whooping cough vaccines starting at 2 months old. You may never have gotten the Tdap vaccine before and did not get it during this pregnancy. If so, you should make sure to get the vaccine immediately after you give birth, before leaving the hospital or birthing center. It will take about 2 weeks before your body develops protection (antibodies) in response to the vaccine. Once you have protection from the vaccine,  you are less likely to give whooping cough to your newborn while caring for him. But remember, your baby will still be at risk for catching whooping cough from others. A recent study looked to see how effective Tdap was at preventing whooping cough in babies whose mothers got the vaccine while pregnant or in the hospital after giving birth. The study found that getting Tdap between 27 through 36 weeks of pregnancy is 85% more effective at preventing whooping cough in babies younger than 2 months old. Blood tests  cannot tell if you need a whooping cough vaccine There are no blood tests that can tell you if you have enough antibodies in your body to protect yourself or your baby against whooping cough. Even if you have been sick with whooping cough in the past or previously received the vaccine, you still should get the vaccine during each pregnancy. Breastfeeding may pass some protective antibodies onto your baby By breastfeeding, you may pass some antibodies you have made in response to the vaccine to your baby. When you get a whooping cough vaccine during your pregnancy, you will have antibodies in your breast milk that you can share with your baby as soon as your milk comes in. However, your baby will not get protective antibodies immediately if you wait to get the whooping cough vaccine until after delivering your baby. This is because it takes about 2 weeks for your body to create antibodies. Learn more about the health benefits of breastfeeding.

## 2023-07-11 ENCOUNTER — Ambulatory Visit

## 2023-07-11 ENCOUNTER — Ambulatory Visit: Payer: Self-pay | Admitting: Obstetrics & Gynecology

## 2023-07-11 DIAGNOSIS — R262 Difficulty in walking, not elsewhere classified: Secondary | ICD-10-CM

## 2023-07-11 DIAGNOSIS — R252 Cramp and spasm: Secondary | ICD-10-CM

## 2023-07-11 DIAGNOSIS — M5459 Other low back pain: Secondary | ICD-10-CM | POA: Diagnosis not present

## 2023-07-11 DIAGNOSIS — R293 Abnormal posture: Secondary | ICD-10-CM

## 2023-07-11 DIAGNOSIS — Z3403 Encounter for supervision of normal first pregnancy, third trimester: Secondary | ICD-10-CM

## 2023-07-11 DIAGNOSIS — M6281 Muscle weakness (generalized): Secondary | ICD-10-CM

## 2023-07-11 LAB — CBC
Hematocrit: 36.2 % (ref 34.0–46.6)
Hemoglobin: 11.8 g/dL (ref 11.1–15.9)
MCH: 29.4 pg (ref 26.6–33.0)
MCHC: 32.6 g/dL (ref 31.5–35.7)
MCV: 90 fL (ref 79–97)
Platelets: 277 10*3/uL (ref 150–450)
RBC: 4.01 x10E6/uL (ref 3.77–5.28)
RDW: 12.3 % (ref 11.7–15.4)
WBC: 15.9 10*3/uL — ABNORMAL HIGH (ref 3.4–10.8)

## 2023-07-11 LAB — GLUCOSE TOLERANCE, 2 HOURS W/ 1HR
Glucose, 1 hour: 130 mg/dL (ref 70–179)
Glucose, 2 hour: 103 mg/dL (ref 70–152)
Glucose, Fasting: 89 mg/dL (ref 70–91)

## 2023-07-11 LAB — RPR: RPR Ser Ql: NONREACTIVE

## 2023-07-11 LAB — HIV ANTIBODY (ROUTINE TESTING W REFLEX): HIV Screen 4th Generation wRfx: NONREACTIVE

## 2023-07-11 NOTE — Therapy (Signed)
 OUTPATIENT PHYSICAL THERAPY THORACOLUMBAR TREATMENT   Patient Name: Norma Deleon MRN: [id number] DOB:August 21, 2000, 23 y.o., female Today's Date: 07/11/2023  END OF SESSION:  PT End of Session - 07/11/23 1016     Visit Number 4    Date for PT Re-Evaluation 08/17/23    Authorization Type Medicaid  [employer] Approved 6 visits-07/02/2023-7/02/20/2023-auth#[id number]    Authorization - Visit Number 4    Authorization - Number of Visits 6    Progress Note Due on Visit 10    PT Start Time 1015    PT Stop Time 1105    PT Time Calculation (min) 50 min    Activity Tolerance Patient tolerated treatment well    Behavior During Therapy WFL for tasks assessed/performed               History reviewed. No pertinent past medical history. Past Surgical History:  Procedure Laterality Date   NO PAST SURGERIES     Patient Active Problem List   Diagnosis Date Noted   Group B streptococcal bacteriuria 06/12/2023   Supervision of normal pregnancy 03/05/2023   Overweight BMI=28.7 04/19/2021    PCP: PCP - General   REFERRING PROVIDER: Julianne Octave, MD  REFERRING DIAG: O99.891,M54.9 (ICD-10-CM) - Back pain in pregnancy O26.899,R10.2 (ICD-10-CM) - Pelvic pain in pregnancy  Rationale for Evaluation and Treatment: Rehabilitation  THERAPY DIAG:  Muscle weakness (generalized)  Difficulty in walking, not elsewhere classified  Cramp and spasm  Abnormal posture  Other low back pain  ONSET DATE: 06/12/2023  SUBJECTIVE:                                                                                                                                                                                           SUBJECTIVE STATEMENT: "I'm doing great. Every day gets a little better."  No pain today.   ([redacted] weeks gestation)  PERTINENT HISTORY:  na  PAIN:  07/11/23 Are you having pain? Yes: NPRS scale: 0/10 Pain location: low back , hips and pelvic floor Pain description:  aching Aggravating factors: sleeping, standing for prolonged periods of time Relieving factors: stretching, walking and typically a hot bath but has not been able to do this due to pregnancy  PRECAUTIONS: Other: pregnancy  RED FLAGS: None   WEIGHT BEARING RESTRICTIONS: No  FALLS:  Has patient fallen in last 6 months? No  LIVING ENVIRONMENT: Lives with: lives with their family Lives in: House/apartment   OCCUPATION: ask next visit  PLOF: Independent, Independent with basic ADLs, Independent with household mobility without device, Independent with community mobility without device, Independent with gait, and Independent with transfers  PATIENT GOALS: to get some relief of the pain so that I can continue a normal pregnancy and not be on bed rest  NEXT MD VISIT: prn  OBJECTIVE:  Note: Objective measures were completed at Evaluation unless otherwise noted.  DIAGNOSTIC FINDINGS:  na  PATIENT SURVEYS:  Modified Oswestry 17/50=34%   COGNITION: Overall cognitive status: Within functional limits for tasks assessed     SENSATION: WFL  MUSCLE LENGTH: Hamstrings: Right 55 deg; Left 50 deg Thomas test: Right neg Left neg  POSTURE: increased lumbar lordosis  PALPATION: Symmetry throughout, slightly tender   LUMBAR ROM:   All WNL  LOWER EXTREMITY ROM:     Hip IR and ER limited bilaterally.  All others WNL  LOWER EXTREMITY MMT:    Generally 4 to 4+/5 bilaterally  LUMBAR SPECIAL TESTS:  Straight leg raise test: Negative  FUNCTIONAL TESTS:  5 times sit to stand: 11.01 sec Timed up and go (TUG): 8.46 sec  GAIT: Distance walked: 50 feet Assistive device utilized: None Level of assistance: Complete Independence Comments: guarded antalgic  TREATMENT DATE:  07/11/23 NuStep L5 x 5' (PT present to discuss status) Seated trunk flexion ball rollouts x 10 each direction Seated trunk SB stretch over peanut with UE reach bil 3x10" Seated mini sit ups with 8 lbs 2 x  10 Seated in reclined position: modified [place] twist with 8 lbs x 20 Seated in reclined position: shoulder to hip x 10 each side Supine with knees and hips at 90 degrees: shoulder pull downs with green tband with handles 2 x 10 Hook lying lower trunk rotation x 20 Hook lying combo hip IR/ER stretch x 20 Supine IT band/lower back/glut stretch: modified position based on tight areas 5 x 10 sec each side Supine posterior pelvic tilt x10 Posterior pelvic tilt with alt march to 90/90 Ice to left lower back x 10 min  07/04/23 Elliptical Incline 2 Resistance 1 x 5 min DN discussed while on elliptical Seated trunk flexion ball rollouts 5x10", flexion with SB 3x10" Kneeling hip flexor with OH reach to hamstring stretch bil 3x20" each LE Standing diagonal chop cable pulley 5lb x8 each way Standing march with bil UE pullback yellow handled band, standing march with bil UE chest press yellow handled band - 5x each leg each direction Supine hip IR/ER x10 each Posterior pelvic tilt with alt march to 90/90 to fatigue. Self STM with ball to lumbar/gluteals Manual: Trigger Point Dry Needling Initial Treatment: Pt instructed on Dry Needling rational, procedures, and possible side effects. Pt instructed to expect mild to moderate muscle soreness later in the day and/or into the next day.  Pt instructed in methods to reduce muscle soreness. Pt instructed to continue prescribed HEP. Patient was educated on signs and symptoms of infection and other risk factors and advised to seek medical attention should they occur.  Patient verbalized understanding of these instructions and education.  Patient Verbal Consent Given: Yes Education Handout Provided: Yes Muscles Treated: L gluteals Electrical Stimulation Performed: No Treatment Response/Outcome: Utilized skilled palpation to identify bony landmarks and trigger points.  Able to illicit twitch response and muscle elongation.  Soft tissue mobilization to L  gluteals following DN to further promote tissue elongation and decreased pain.  Also QL release L side.  06/29/23 NuStep L5 x 4' Seated trunk flexion ball rollouts 5x10", flexion with SB 3x10" Seated trunk SB stretch over peanut with UE reach bil 3x10" Seated on blue physioball x 20 each: tail wag, circles, tucks/lifts Review of  SL sleeping position options using pillow propping Kneeling hip flexor with OH reach to hamstring stretch bil 3x20" each LE Half kneel diagonal lift yellow ball x8 Standing diagonal chop cable pulley 5lb x8 each way Standing march with bil UE pullback yellow handled band, standing march with bil UE pullforward yellow handled band - 5x each leg each direction Supine hip IR/ER and LTR x10 each Supine posterior pelvic tilt x10 Posterior pelvic tilt with alt march to 90/90  06/22/23 Initial eval completed Initiated HEP (any treatment provided on initial visit not billed due to insurance restrictions)                                                                                                                                  PATIENT EDUCATION:  Education details: Initiated HEP Person educated: Patient Education method: Programmer, multimedia, Facilities manager, Verbal cues, and Handouts Education comprehension: verbalized understanding, returned demonstration, and verbal cues required  HOME EXERCISE PROGRAM: Access Code: [facility] URL: [email or web address] Date: 06/29/2023 Prepared by: Minor Amble Beuhring  Exercises - Standing Hamstring Stretch on Chair  - 1 x daily - 7 x weekly - 1 sets - 3 reps - 30 sec hold - Quadricep Stretch with Chair and Counter Support  - 1 x daily - 7 x weekly - 1 sets - 3 reps - 30 sec hold - Seated Figure 4 Piriformis Stretch  - 1 x daily - 7 x weekly - 1 sets - 3 reps - 30 sed hold - Half Kneel Dynamic Hamstrings and Contralateral Hip Flexor Stretch  - 1 x daily - 7 x weekly - 1 sets - 3 reps - 15 hold - Half Kneeling Diagonal Chops  with Medicine Ball  - Opposite Forward Leg  - 1 x daily - 7 x weekly - 1 sets - 8 reps - Supine Posterior Pelvic Tilt  - 1 x daily - 7 x weekly - 1 sets - 10 reps - Supine 90/90 Alternating Heel Touches with Posterior Pelvic Tilt  - 1 x daily - 7 x weekly - 1 sets - 10 reps  ASSESSMENT:  CLINICAL IMPRESSION: [place] is progressing appropriately and responding well to current POC.  She is well motivated and compliant.  She should continue to do well.  She would benefit from continuing skilled PT to address goals below.    OBJECTIVE IMPAIRMENTS: difficulty walking, decreased ROM, decreased strength, increased fascial restrictions, increased muscle spasms, impaired flexibility, postural dysfunction, and pain.   ACTIVITY LIMITATIONS: carrying, lifting, bending, standing, squatting, sleeping, stairs, transfers, bed mobility, bathing, dressing, and caring for others  PARTICIPATION LIMITATIONS: meal prep, cleaning, laundry, driving, shopping, community activity, occupation, and yard work  PERSONAL FACTORS: Fitness, Past/current experiences, and 1 comorbidity: pregnancy are also affecting patient's functional outcome.   REHAB POTENTIAL: Excellent  CLINICAL DECISION MAKING: Stable/uncomplicated  EVALUATION COMPLEXITY: Low   GOALS: Goals reviewed with patient? Yes  SHORT TERM GOALS: Target date: 07/20/2023  Pain report to  be no greater than 4/10  Baseline: Goal status: INITIAL  2.  Patient will be independent with initial HEP  Baseline:  Goal status: INITIAL   LONG TERM GOALS: Target date: 08/17/2023  Patient to report pain no greater than 2/10  Baseline:  Goal status: INITIAL  2.  Patient to be independent with advanced HEP  Baseline:  Goal status: INITIAL  3.  ODI to be 24% or less Baseline:  Goal status: INITIAL  4.  Patient to be able to stand or walk for at least 15 min without hip pain  Baseline:  Goal status: INITIAL  5.  Patient to be able to bend, stoop and squat with  pain no greater than 2/10  Baseline:  Goal status: INITIAL  6.  Functional tests to improve by 2-3 seconds Baseline:  Goal status: INITIAL  PLAN:  PT FREQUENCY: 1-2x/week  PT DURATION: 8 weeks  PLANNED INTERVENTIONS: 97110-Therapeutic exercises, 97530- Therapeutic activity, [id number]- Neuromuscular re-education, 97535- Self Care, 09811- Manual therapy, [phone]- Gait training, [phone]- Aquatic Therapy, Patient/Family education, Stair training, Taping, Dry Needling, Cryotherapy, and Moist heat.  PLAN FOR NEXT SESSION: Review HEP, Nustep, continue core and pelvic floor strengthening  Ruperto Kiernan B. Yavonne Kiss, PT 07/11/23 11:25 AM [facility] [address], [address] [place], [place] [address] Phone # [phone] Fax [phone]

## 2023-07-19 ENCOUNTER — Ambulatory Visit: Attending: Obstetrics & Gynecology

## 2023-07-19 DIAGNOSIS — M5459 Other low back pain: Secondary | ICD-10-CM | POA: Diagnosis present

## 2023-07-19 DIAGNOSIS — M6281 Muscle weakness (generalized): Secondary | ICD-10-CM | POA: Insufficient documentation

## 2023-07-19 DIAGNOSIS — R293 Abnormal posture: Secondary | ICD-10-CM | POA: Insufficient documentation

## 2023-07-19 DIAGNOSIS — R262 Difficulty in walking, not elsewhere classified: Secondary | ICD-10-CM | POA: Diagnosis present

## 2023-07-19 DIAGNOSIS — M25552 Pain in left hip: Secondary | ICD-10-CM | POA: Insufficient documentation

## 2023-07-19 DIAGNOSIS — R252 Cramp and spasm: Secondary | ICD-10-CM | POA: Insufficient documentation

## 2023-07-19 NOTE — Therapy (Addendum)
 OUTPATIENT PHYSICAL THERAPY THORACOLUMBAR TREATMENT   Patient Name: Norma Deleon MRN: [id number] DOB:2000-10-29, 23 y.o., female Today's Date: 07/19/2023  END OF SESSION:  PT End of Session - 07/19/23 1107     Visit Number 5    Date for PT Re-Evaluation 08/17/23    Authorization Type Medicaid  [employer] Approved 6 visits-07/02/2023-7/02/20/2023-auth#[id number]    Authorization - Visit Number 5    Authorization - Number of Visits 6    Progress Note Due on Visit 10    PT Start Time 1104    PT Stop Time 1153    PT Time Calculation (min) 49 min    Activity Tolerance Patient tolerated treatment well    Behavior During Therapy WFL for tasks assessed/performed               History reviewed. No pertinent past medical history. Past Surgical History:  Procedure Laterality Date   NO PAST SURGERIES     Patient Active Problem List   Diagnosis Date Noted   Group B streptococcal bacteriuria 06/12/2023   Supervision of normal pregnancy 03/05/2023   Overweight BMI=28.7 04/19/2021    PCP: PCP - General   REFERRING PROVIDER: Julianne Octave, MD  REFERRING DIAG: O99.891,M54.9 (ICD-10-CM) - Back pain in pregnancy O26.899,R10.2 (ICD-10-CM) - Pelvic pain in pregnancy  Rationale for Evaluation and Treatment: Rehabilitation  THERAPY DIAG:  Other low back pain  Pain in left hip  Muscle weakness (generalized)  Difficulty in walking, not elsewhere classified  Cramp and spasm  Abnormal posture  ONSET DATE: 06/12/2023  SUBJECTIVE:                                                                                                                                                                                           SUBJECTIVE STATEMENT: Patient reports she is doing well during the day,  "I just still have pain in the left hip at night when I lay on my left side"  PERTINENT HISTORY:  na  PAIN:  07/19/23 Are you having pain? Yes: NPRS scale: 0/10 now, at night  4-5/10 in left hip Pain location: low back , hips and pelvic floor Pain description: aching Aggravating factors: sleeping, standing for prolonged periods of time Relieving factors: stretching, walking and typically a hot bath but has not been able to do this due to pregnancy  PRECAUTIONS: Other: pregnancy  RED FLAGS: None   WEIGHT BEARING RESTRICTIONS: No  FALLS:  Has patient fallen in last 6 months? No  LIVING ENVIRONMENT: Lives with: lives with their family Lives in: House/apartment   OCCUPATION: ask next visit  PLOF: Independent, Independent  with basic ADLs, Independent with household mobility without device, Independent with community mobility without device, Independent with gait, and Independent with transfers  PATIENT GOALS: to get some relief of the pain so that I can continue a normal pregnancy and not be on bed rest  NEXT MD VISIT: prn  OBJECTIVE:  Note: Objective measures were completed at Evaluation unless otherwise noted.  DIAGNOSTIC FINDINGS:  na  PATIENT SURVEYS:  Initial eval: Modified Oswestry 17/50=34%   07/19/23: Modified Oswestry 12/50=24%   COGNITION: Overall cognitive status: Within functional limits for tasks assessed     SENSATION: WFL  MUSCLE LENGTH: Hamstrings: Right 55 deg; Left 50 deg Thomas test: Right neg Left neg  POSTURE: increased lumbar lordosis  PALPATION: Symmetry throughout, slightly tender   LUMBAR ROM:   All WNL  LOWER EXTREMITY ROM:     Hip IR and ER limited bilaterally.  All others WNL  LOWER EXTREMITY MMT:    Generally 4 to 4+/5 bilaterally  LUMBAR SPECIAL TESTS:  Straight leg raise test: Negative  FUNCTIONAL TESTS:   Initial eval: 5 times sit to stand: 11.01 sec Timed up and go (TUG): 8.46 sec  07/19/23: 5 times sit to stand: 8.93 sec Timed up and go (TUG): 5.47 sec  GAIT: Distance walked: 50 feet Assistive device utilized: None Level of assistance: Complete Independence Comments: guarded  antalgic  TREATMENT DATE:  07/19/23 NuStep L5 x 5' (PT present to discuss status) Piriformis stretch (seated) 3 x 30 sec Re-assessment completed: see below Hip matrix: hip abduction and extension 2 x 10 with 30 lbs Lateral band walks with yellow loop 3 laps of 15 feet Seated clamshell with yellow loop x 20 Side lying clam 2 x 10 no resistance Side lying reverse clam 2 x 10 Supine iron cross x 2 each side with PT assistance to support weight of the leg holding 30 seconds Ice to left lower back x 10 min  07/11/23 NuStep L5 x 5' (PT present to discuss status) Seated trunk flexion ball rollouts x 10 each direction Seated trunk SB stretch over peanut with UE reach bil 3x10" Seated mini sit ups with 8 lbs 2 x 10 Seated in reclined position: modified [place] twist with 8 lbs x 20 Seated in reclined position: shoulder to hip x 10 each side Supine with knees and hips at 90 degrees: shoulder pull downs with green tband with handles 2 x 10 Hook lying lower trunk rotation x 20 Hook lying combo hip IR/ER stretch x 20 Supine IT band/lower back/glut stretch: modified position based on tight areas 5 x 10 sec each side Supine posterior pelvic tilt x10 Posterior pelvic tilt with alt march to 90/90 Ice to left lower back x 10 min  07/04/23 Elliptical Incline 2 Resistance 1 x 5 min DN discussed while on elliptical Seated trunk flexion ball rollouts 5x10", flexion with SB 3x10" Kneeling hip flexor with OH reach to hamstring stretch bil 3x20" each LE Standing diagonal chop cable pulley 5lb x8 each way Standing march with bil UE pullback yellow handled band, standing march with bil UE chest press yellow handled band - 5x each leg each direction Supine hip IR/ER x10 each Posterior pelvic tilt with alt march to 90/90 to fatigue. Self STM with ball to lumbar/gluteals Manual: Trigger Point Dry Needling Initial Treatment: Pt instructed on Dry Needling rational, procedures, and possible side effects. Pt  instructed to expect mild to moderate muscle soreness later in the day and/or into the next day.  Pt instructed in  methods to reduce muscle soreness. Pt instructed to continue prescribed HEP. Patient was educated on signs and symptoms of infection and other risk factors and advised to seek medical attention should they occur.  Patient verbalized understanding of these instructions and education.  Patient Verbal Consent Given: Yes Education Handout Provided: Yes Muscles Treated: L gluteals Electrical Stimulation Performed: No Treatment Response/Outcome: Utilized skilled palpation to identify bony landmarks and trigger points.  Able to illicit twitch response and muscle elongation.  Soft tissue mobilization to L gluteals following DN to further promote tissue elongation and decreased pain.  Also QL release L side.  06/22/23 Initial eval completed Initiated HEP (any treatment provided on initial visit not billed due to insurance restrictions)                                                                                                                         PATIENT EDUCATION:  Education details: Initiated HEP Person educated: Patient Education method: Programmer, multimedia, Facilities manager, Verbal cues, and Handouts Education comprehension: verbalized understanding, returned demonstration, and verbal cues required  HOME EXERCISE PROGRAM: Access Code: [facility] URL: [email or web address] Date: 06/29/2023 Prepared by: Minor Amble Beuhring  Exercises - Standing Hamstring Stretch on Chair  - 1 x daily - 7 x weekly - 1 sets - 3 reps - 30 sec hold - Quadricep Stretch with Chair and Counter Support  - 1 x daily - 7 x weekly - 1 sets - 3 reps - 30 sec hold - Seated Figure 4 Piriformis Stretch  - 1 x daily - 7 x weekly - 1 sets - 3 reps - 30 sed hold - Half Kneel Dynamic Hamstrings and Contralateral Hip Flexor Stretch  - 1 x daily - 7 x weekly - 1 sets - 3 reps - 15 hold - Half Kneeling Diagonal  Chops with Medicine Ball  - Opposite Forward Leg  - 1 x daily - 7 x weekly - 1 sets - 8 reps - Supine Posterior Pelvic Tilt  - 1 x daily - 7 x weekly - 1 sets - 10 reps - Supine 90/90 Alternating Heel Touches with Posterior Pelvic Tilt  - 1 x daily - 7 x weekly - 1 sets - 10 reps  ASSESSMENT:  CLINICAL IMPRESSION: Eymi is progressing appropriately and responding well to current POC.  She is well motivated and compliant.  Her pain level during the day is generally a 0/10 but she continues to have left hip pain at night that interferes with her sleep.  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  This should continue to improve and avoid patient She should continue to do well.  She would benefit from continuing skilled PT to address goals below.    OBJECTIVE IMPAIRMENTS: difficulty walking, decreased ROM, decreased strength, increased fascial restrictions, increased muscle spasms, impaired flexibility, postural dysfunction, and pain.   ACTIVITY LIMITATIONS: carrying, lifting, bending, standing, squatting, sleeping, stairs, transfers, bed mobility, bathing, dressing, and  caring for others  PARTICIPATION LIMITATIONS: meal prep, cleaning, laundry, driving, shopping, community activity, occupation, and yard work  PERSONAL FACTORS: Fitness, Past/current experiences, and 1 comorbidity: pregnancy are also affecting patient's functional outcome.   REHAB POTENTIAL: Excellent  CLINICAL DECISION MAKING: Stable/uncomplicated  EVALUATION COMPLEXITY: Low   GOALS: Goals reviewed with patient? Yes  SHORT TERM GOALS: Target date: 07/20/2023  Pain report to be no greater than 4/10  Baseline: Goal status: MET 07/19/23  2.  Patient will be independent with initial HEP  Baseline:  Goal status: MET 07/19/23   LONG TERM GOALS: Target date: 08/17/2023  Patient to report pain no greater than 2/10  Baseline:  Goal status: In progress  2.  Patient  to be independent with advanced HEP  Baseline:  Goal status: In progress  3.  ODI to be 24% or less Baseline:  Goal status: MET 07/19/23  4.  Patient to be able to stand or walk for at least 15 min without hip pain  Baseline:  Goal status: In progress  5.  Patient to be able to bend, stoop and squat with pain no greater than 2/10  Baseline:  Goal status: In progress  6.  Functional tests to improve by 2-3 seconds Baseline:  Goal status: MET 07/19/23  PLAN:  PT FREQUENCY: 1-2x/week  PT DURATION: 8 weeks  PLANNED INTERVENTIONS: 97110-Therapeutic exercises, 97530- Therapeutic activity, 97112- Neuromuscular re-education, 97535- Self Care, 16109- Manual therapy, [phone]- Gait training, [phone]- Aquatic Therapy, Patient/Family education, Stair training, Taping, Dry Needling, Cryotherapy, and Moist heat.  PLAN FOR NEXT SESSION: Recert completed: requested 6 more visits from 07/27/23 through 09/07/23.  Check for auth.  Progress HEP, Nustep, hip strengthening, continue core and pelvic floor strengthening  Christianne Zacher B. Ranesha Val, PT 07/19/23 3:52 PM [facility] [address] [place], [place] [address] Phone # [phone] Fax [phone]

## 2023-07-25 ENCOUNTER — Encounter: Payer: Self-pay | Admitting: Certified Nurse Midwife

## 2023-07-25 ENCOUNTER — Encounter: Admitting: Certified Nurse Midwife

## 2023-07-25 ENCOUNTER — Ambulatory Visit: Admitting: Certified Nurse Midwife

## 2023-07-25 VITALS — BP 133/77 | HR 101 | Wt 192.0 lb

## 2023-07-25 DIAGNOSIS — Z3403 Encounter for supervision of normal first pregnancy, third trimester: Secondary | ICD-10-CM | POA: Diagnosis not present

## 2023-07-25 DIAGNOSIS — Z3A30 30 weeks gestation of pregnancy: Secondary | ICD-10-CM

## 2023-07-25 DIAGNOSIS — Z362 Encounter for other antenatal screening follow-up: Secondary | ICD-10-CM

## 2023-07-25 NOTE — Progress Notes (Signed)
 ROB   CC: None

## 2023-07-26 ENCOUNTER — Encounter: Payer: Self-pay | Admitting: Physical Therapy

## 2023-07-26 ENCOUNTER — Ambulatory Visit: Admitting: Physical Therapy

## 2023-07-26 DIAGNOSIS — M5459 Other low back pain: Secondary | ICD-10-CM | POA: Diagnosis not present

## 2023-07-26 DIAGNOSIS — R262 Difficulty in walking, not elsewhere classified: Secondary | ICD-10-CM

## 2023-07-26 DIAGNOSIS — M25552 Pain in left hip: Secondary | ICD-10-CM

## 2023-07-26 DIAGNOSIS — M6281 Muscle weakness (generalized): Secondary | ICD-10-CM

## 2023-07-26 DIAGNOSIS — R252 Cramp and spasm: Secondary | ICD-10-CM

## 2023-07-26 DIAGNOSIS — R293 Abnormal posture: Secondary | ICD-10-CM

## 2023-07-26 NOTE — Therapy (Signed)
 OUTPATIENT PHYSICAL THERAPY THORACOLUMBAR TREATMENT   Patient Name: Norma Deleon MRN: [id number] DOB:Dec 22, 2000, 23 y.o., female Today's Date: 07/26/2023  END OF SESSION:  PT End of Session - 07/26/23 1406     Visit Number 6    Date for PT Re-Evaluation 08/17/23    Authorization Type Medicaid  [place] [employer] Approved 6 visits-07/02/2023-7/02/20/2023-auth#[id number]    Authorization - Visit Number 6    Authorization - Number of Visits 6    Progress Note Due on Visit 10    PT Start Time 1106    PT Stop Time 1157    PT Time Calculation (min) 51 min    Activity Tolerance Patient tolerated treatment well    Behavior During Therapy WFL for tasks assessed/performed             History reviewed. No pertinent past medical history. Past Surgical History:  Procedure Laterality Date   NO PAST SURGERIES     Patient Active Problem List   Diagnosis Date Noted   Group B streptococcal bacteriuria 06/12/2023   Supervision of normal pregnancy 03/05/2023   Overweight BMI=28.7 04/19/2021    PCP: PCP - General   REFERRING PROVIDER: Julianne Octave, MD  REFERRING DIAG: O99.891,M54.9 (ICD-10-CM) - Back pain in pregnancy O26.899,R10.2 (ICD-10-CM) - Pelvic pain in pregnancy  Rationale for Evaluation and Treatment: Rehabilitation  THERAPY DIAG:  Other low back pain  Pain in left hip  Muscle weakness (generalized)  Difficulty in walking, not elsewhere classified  Cramp and spasm  Abnormal posture  ONSET DATE: 06/12/2023  SUBJECTIVE:                                                                                                                                                                                           SUBJECTIVE STATEMENT: Patient reports she is doing okay today. Pain is 3/10.  PERTINENT HISTORY:  na  PAIN:  07/26/23 Are you having pain? Yes: NPRS scale: 0/10 now, at night 4-5/10 in left hip Pain location: low back , hips and pelvic floor Pain  description: aching Aggravating factors: sleeping, standing for prolonged periods of time Relieving factors: stretching, walking and typically a hot bath but has not been able to do this due to pregnancy  PRECAUTIONS: Other: pregnancy  RED FLAGS: None   WEIGHT BEARING RESTRICTIONS: No  FALLS:  Has patient fallen in last 6 months? No  LIVING ENVIRONMENT: Lives with: lives with their family Lives in: House/apartment   OCCUPATION: ask next visit  PLOF: Independent, Independent with basic ADLs, Independent with household mobility without device, Independent with community mobility without device, Independent with gait, and Independent  with transfers  PATIENT GOALS: to get some relief of the pain so that I can continue a normal pregnancy and not be on bed rest  NEXT MD VISIT: prn  OBJECTIVE:  Note: Objective measures were completed at Evaluation unless otherwise noted.  DIAGNOSTIC FINDINGS:  na  PATIENT SURVEYS:  Initial eval: Modified Oswestry 17/50=34%   07/19/23: Modified Oswestry 12/50=24%   COGNITION: Overall cognitive status: Within functional limits for tasks assessed     SENSATION: WFL  MUSCLE LENGTH: Hamstrings: Right 55 deg; Left 50 deg Thomas test: Right neg Left neg  POSTURE: increased lumbar lordosis  PALPATION: Symmetry throughout, slightly tender   LUMBAR ROM:   All WNL  LOWER EXTREMITY ROM:     Hip IR and ER limited bilaterally.  All others WNL  LOWER EXTREMITY MMT:    Generally 4 to 4+/5 bilaterally  LUMBAR SPECIAL TESTS:  Straight leg raise test: Negative  FUNCTIONAL TESTS:   Initial eval: 5 times sit to stand: 11.01 sec Timed up and go (TUG): 8.46 sec  07/19/23: 5 times sit to stand: 8.93 sec Timed up and go (TUG): 5.47 sec  GAIT: Distance walked: 50 feet Assistive device utilized: None Level of assistance: Complete Independence Comments: guarded antalgic  TREATMENT DATE:  07/26/23 NuStep L5 x 5' (PT present to discuss  status) Piriformis stretch (seated) 3 x 30 sec Iron cross stretch with green loop 2 x 20sec bilateral  Side lying clam 2 x 10 no resistance Side lying reverse clam 2 x 10 Education on breathing technique and core engagement when changing positions on plinth Seated clamshell with red loop 2 x 20 (black loop next time) Quadruped TA activation 2 x 10 Pallof Press with long red loop x 20 each direction Hip matrix: hip abduction and extension 2 x 10 with 30 lbs Ice to left lower back x 10 min    07/19/23 NuStep L5 x 5' (PT present to discuss status) Piriformis stretch (seated) 3 x 30 sec Re-assessment completed: see below Hip matrix: hip abduction and extension 2 x 10 with 30 lbs Lateral band walks with yellow loop 3 laps of 15 feet Seated clamshell with yellow loop x 20 Side lying clam 2 x 10 no resistance Side lying reverse clam 2 x 10 Supine iron cross x 2 each side with PT assistance to support weight of the leg holding 30 seconds Ice to left lower back x 10 min  07/11/23 NuStep L5 x 5' (PT present to discuss status) Seated trunk flexion ball rollouts x 10 each direction Seated trunk SB stretch over peanut with UE reach bil 3x10 Seated mini sit ups with 8 lbs 2 x 10 Seated in reclined position: modified [place] twist with 8 lbs x 20 Seated in reclined position: shoulder to hip x 10 each side Supine with knees and hips at 90 degrees: shoulder pull downs with green tband with handles 2 x 10 Hook lying lower trunk rotation x 20 Hook lying combo hip IR/ER stretch x 20 Supine IT band/lower back/glut stretch: modified position based on tight areas 5 x 10 sec each side Supine posterior pelvic tilt x10 Posterior pelvic tilt with alt march to 90/90 Ice to left lower back x 10 min   PATIENT EDUCATION:  Education details: Initiated HEP Person educated: Patient Education method: Programmer, multimedia, Facilities manager, Verbal cues, and Handouts Education comprehension: verbalized understanding,  returned demonstration, and verbal cues required  HOME EXERCISE PROGRAM: Access Code: [facility] URL: [email or web address] Date: 06/29/2023 Prepared by: Raynell Caller  Exercises -  Standing Hamstring Stretch on Chair  - 1 x daily - 7 x weekly - 1 sets - 3 reps - 30 sec hold - Quadricep Stretch with Chair and Counter Support  - 1 x daily - 7 x weekly - 1 sets - 3 reps - 30 sec hold - Seated Figure 4 Piriformis Stretch  - 1 x daily - 7 x weekly - 1 sets - 3 reps - 30 sed hold - Half Kneel Dynamic Hamstrings and Contralateral Hip Flexor Stretch  - 1 x daily - 7 x weekly - 1 sets - 3 reps - 15 hold - Half Kneeling Diagonal Chops with Medicine Ball  - Opposite Forward Leg  - 1 x daily - 7 x weekly - 1 sets - 8 reps - Supine Posterior Pelvic Tilt  - 1 x daily - 7 x weekly - 1 sets - 10 reps - Supine 90/90 Alternating Heel Touches with Posterior Pelvic Tilt  - 1 x daily - 7 x weekly - 1 sets - 10 reps  ASSESSMENT:  CLINICAL IMPRESSION: Azzure verbalized feeling 50% better since starting therapy. Her pain intensity is not as bad compared to when she first started. Provided patient education and demonstration for correct breathing technique and core engagement when performing bed mobility. Patient demonstrated good carry over of technique. PT monitored patient throughout session and provided verbal and visual cues as needed. Incorporated core exercises today and she tolerated them well. Patient will benefit from skilled PT to address the below impairments and improve overall function.     OBJECTIVE IMPAIRMENTS: difficulty walking, decreased ROM, decreased strength, increased fascial restrictions, increased muscle spasms, impaired flexibility, postural dysfunction, and pain.   ACTIVITY LIMITATIONS: carrying, lifting, bending, standing, squatting, sleeping, stairs, transfers, bed mobility, bathing, dressing, and caring for others  PARTICIPATION LIMITATIONS: meal prep, cleaning,  laundry, driving, shopping, community activity, occupation, and yard work  PERSONAL FACTORS: Fitness, Past/current experiences, and 1 comorbidity: pregnancy are also affecting patient's functional outcome.   REHAB POTENTIAL: Excellent  CLINICAL DECISION MAKING: Stable/uncomplicated  EVALUATION COMPLEXITY: Low   GOALS: Goals reviewed with patient? Yes  SHORT TERM GOALS: Target date: 07/20/2023  Pain report to be no greater than 4/10  Baseline: Goal status: MET 07/19/23  2.  Patient will be independent with initial HEP  Baseline:  Goal status: MET 07/19/23   LONG TERM GOALS: Target date: 08/17/2023  Patient to report pain no greater than 2/10  Baseline:  Goal status: In progress  2.  Patient to be independent with advanced HEP  Baseline:  Goal status: In progress  3.  ODI to be 24% or less Baseline:  Goal status: MET 07/19/23  4.  Patient to be able to stand or walk for at least 15 min without hip pain  Baseline:  Goal status: In progress  5.  Patient to be able to bend, stoop and squat with pain no greater than 2/10  Baseline:  Goal status: In progress  6.  Functional tests to improve by 2-3 seconds Baseline:  Goal status: MET 07/19/23  PLAN:  PT FREQUENCY: 1-2x/week  PT DURATION: 8 weeks  PLANNED INTERVENTIONS: 97110-Therapeutic exercises, 97530- Therapeutic activity, 97112- Neuromuscular re-education, 97535- Self Care, 10960- Manual therapy, [phone]- Gait training, [phone]- Aquatic Therapy, Patient/Family education, Stair training, Taping, Dry Needling, Cryotherapy, and Moist heat.  PLAN FOR NEXT SESSION:see if more visits were given requested 6 more visits from 07/27/23 through 09/07/23.  Assess tolerance to treatment session continue core and pelvic floor strengthening  Solangel Mcmanaway  Luster Salters, PT 07/26/23 2:07 PM [facility] [address], [address] [place], [place] [address] Phone # [phone] Fax [phone]

## 2023-07-28 NOTE — Progress Notes (Signed)
   PRENATAL VISIT NOTE  Subjective:  Norma Deleon is a 23 y.o. G1P0000 at [redacted]w[redacted]d being seen today for ongoing prenatal care.  She is currently monitored for the following issues for this low-risk pregnancy and has Overweight BMI=28.7; Supervision of normal pregnancy; and Group B streptococcal bacteriuria on their problem list.  Patient reports no complaints.  Contractions: Irritability. Vag. Bleeding: None.  Movement: Present. Denies leaking of fluid.   The following portions of the patient's history were reviewed and updated as appropriate: allergies, current medications, past family history, past medical history, past social history, past surgical history and problem list.   Objective:    Vitals:   07/25/23 1612  BP: 133/77  Pulse: (!) 101  Weight: 192 lb (87.1 kg)    Fetal Status:  Fetal Heart Rate (bpm): 154 Fundal Height: 30 cm Movement: Present    General: Alert, oriented and cooperative. Patient is in no acute distress.  Skin: Skin is warm and dry. No rash noted.   Cardiovascular: Normal heart rate noted  Respiratory: Normal respiratory effort, no problems with respiration noted  Abdomen: Soft, gravid, appropriate for gestational age.  Pain/Pressure: Present     Pelvic: Cervical exam deferred        Extremities: Normal range of motion.  Edema: Trace  Mental Status: Normal mood and affect. Normal behavior. Normal judgment and thought content.   Assessment and Plan:  Pregnancy: G1P0000 at [redacted]w[redacted]d 1. [redacted] weeks gestation of pregnancy (Primary) - Patient doing well.  - Reports vigorous and frequent fetal movement  2. Encounter for supervision of normal first pregnancy in third trimester - Reviewed 3rd trimester expectations.  - Fundal height appropriate for gestational age   75. Encounter for follow-up ultrasound of fetal anatomy - US  MFM OB LIMITED; Future - Heart Views originally unseen. Patient missed F/u appointment and was not rescheduled. - Discussed the possibility  that fetus may too large to see a heart views and patient verbalized understanding.   Preterm labor symptoms and general obstetric precautions including but not limited to vaginal bleeding, contractions, leaking of fluid and fetal movement were reviewed in detail with the patient. Please refer to After Visit Summary for other counseling recommendations.   Return in about 2 weeks (around 08/08/2023) for LOB.  Future Appointments  Date Time Provider Department Center  08/02/2023 11:00 AM Penelope Bowie, PT [facility] None  08/08/2023 10:35 AM Tari Fare, CNM [facility]  08/09/2023 10:15 AM Gorge Laud, PT [facility] None  08/16/2023 10:15 AM Penelope Bowie, PT [facility] None  08/22/2023 11:15 AM Thurmon Florida, Kathrine Paris, MD [facility]  08/23/2023 10:15 AM Penelope Bowie, PT [facility] None  09/05/2023 11:15 AM Almond Army, CNM [facility]  09/12/2023 11:15 AM Tari Fare, CNM [facility]    Nhyla Nappi Maurie Southern) Marlys Singh, MSN, CNM  Center for [facility]  07/28/2023 12:38 PM

## 2023-08-02 ENCOUNTER — Ambulatory Visit: Admitting: Physical Therapy

## 2023-08-02 ENCOUNTER — Encounter: Payer: Self-pay | Admitting: Physical Therapy

## 2023-08-02 DIAGNOSIS — R252 Cramp and spasm: Secondary | ICD-10-CM

## 2023-08-02 DIAGNOSIS — M6281 Muscle weakness (generalized): Secondary | ICD-10-CM

## 2023-08-02 DIAGNOSIS — R262 Difficulty in walking, not elsewhere classified: Secondary | ICD-10-CM

## 2023-08-02 DIAGNOSIS — M5459 Other low back pain: Secondary | ICD-10-CM

## 2023-08-02 DIAGNOSIS — M25552 Pain in left hip: Secondary | ICD-10-CM

## 2023-08-02 DIAGNOSIS — R293 Abnormal posture: Secondary | ICD-10-CM

## 2023-08-02 NOTE — Therapy (Signed)
 OUTPATIENT PHYSICAL THERAPY THORACOLUMBAR TREATMENT   Patient Name: Norma Deleon MRN: [id number] DOB:20-Nov-2000, 23 y.o., female Today's Date: 08/02/2023  END OF SESSION:  PT End of Session - 08/02/23 1406     Visit Number 7    Date for PT Re-Evaluation 08/17/23    Authorization Type Medicaid  [place] [employer] Approved 6 visits-07/02/2023-7/02/20/2023-auth#[id number]    Authorization - Visit Number 6    Authorization - Number of Visits 6    PT Start Time 1100    PT Stop Time 1148    PT Time Calculation (min) 48 min    Activity Tolerance Patient tolerated treatment well    Behavior During Therapy WFL for tasks assessed/performed              History reviewed. No pertinent past medical history. Past Surgical History:  Procedure Laterality Date   NO PAST SURGERIES     Patient Active Problem List   Diagnosis Date Noted   Group B streptococcal bacteriuria 06/12/2023   Supervision of normal pregnancy 03/05/2023   Overweight BMI=28.7 04/19/2021    PCP: PCP - General   REFERRING PROVIDER: Julianne Octave, MD  REFERRING DIAG: O99.891,M54.9 (ICD-10-CM) - Back pain in pregnancy O26.899,R10.2 (ICD-10-CM) - Pelvic pain in pregnancy  Rationale for Evaluation and Treatment: Rehabilitation  THERAPY DIAG:  Other low back pain  Pain in left hip  Muscle weakness (generalized)  Difficulty in walking, not elsewhere classified  Cramp and spasm  Abnormal posture  ONSET DATE: 06/12/2023  SUBJECTIVE:                                                                                                                                                                                           SUBJECTIVE STATEMENT: Patient reports she is doing okay today. She is hot from the weather. She has been trying to stay hydrated. No pain today. She pain is on her right side now not the left with sleeping.  PERTINENT HISTORY:  na  PAIN:  08/02/23 Are you having pain? Yes: NPRS  scale: 0/10  Pain location: low back , hips and pelvic floor Pain description: aching Aggravating factors: sleeping, standing for prolonged periods of time Relieving factors: stretching, walking and typically a hot bath but has not been able to do this due to pregnancy  PRECAUTIONS: Other: pregnancy  RED FLAGS: None   WEIGHT BEARING RESTRICTIONS: No  FALLS:  Has patient fallen in last 6 months? No  LIVING ENVIRONMENT: Lives with: lives with their family Lives in: House/apartment   OCCUPATION: ask next visit  PLOF: Independent, Independent with basic ADLs, Independent with household mobility without  device, Independent with community mobility without device, Independent with gait, and Independent with transfers  PATIENT GOALS: to get some relief of the pain so that I can continue a normal pregnancy and not be on bed rest  NEXT MD VISIT: prn  OBJECTIVE:  Note: Objective measures were completed at Evaluation unless otherwise noted.  DIAGNOSTIC FINDINGS:  na  PATIENT SURVEYS:  Initial eval: Modified Oswestry 17/50=34%   07/19/23: Modified Oswestry 12/50=24%   COGNITION: Overall cognitive status: Within functional limits for tasks assessed     SENSATION: WFL  MUSCLE LENGTH: Hamstrings: Right 55 deg; Left 50 deg Thomas test: Right neg Left neg  POSTURE: increased lumbar lordosis  PALPATION: Symmetry throughout, slightly tender   LUMBAR ROM:   All WNL  LOWER EXTREMITY ROM:     Hip IR and ER limited bilaterally.  All others WNL  LOWER EXTREMITY MMT:    Generally 4 to 4+/5 bilaterally  LUMBAR SPECIAL TESTS:  Straight leg raise test: Negative  FUNCTIONAL TESTS:   Initial eval: 5 times sit to stand: 11.01 sec Timed up and go (TUG): 8.46 sec  07/19/23: 5 times sit to stand: 8.93 sec Timed up and go (TUG): 5.47 sec  GAIT: Distance walked: 50 feet Assistive device utilized: None Level of assistance: Complete Independence Comments: guarded  antalgic  TREATMENT DATE:  08/02/23 NuStep L3 x 5' (PT present to discuss status) Piriformis stretch (seated) 3 x 30 sec Hamstring stretch at stair 2 x 30 sec bilateral  Seated QL stretch with peanut stability ball 3 x 10 sec bilateral  Quadruped TA activation 2 x 10 Quadruped slow rock x 5 each direction Side lying clam 2 x 10 no resistance Side lying reverse clam 2 x 10 Seated clamshell with black loop x 20 (black loop next time) Pallof Press with long red loop 2 x 15 each direction Ice to left lower back x 10 min   07/26/23 NuStep L5 x 5' (PT present to discuss status) Piriformis stretch (seated) 3 x 30 sec Iron cross stretch with green loop 2 x 20sec bilateral  Side lying clam 2 x 10 no resistance Side lying reverse clam 2 x 10 Education on breathing technique and core engagement when changing positions on plinth Seated clamshell with red loop 2 x 20 (black loop next time) Quadruped TA activation 2 x 10 Pallof Press with long red loop x 20 each direction Hip matrix: hip abduction and extension 2 x 10 with 30 lbs Ice to left lower back x 10 min    07/19/23 NuStep L5 x 5' (PT present to discuss status) Piriformis stretch (seated) 3 x 30 sec Re-assessment completed: see below Hip matrix: hip abduction and extension 2 x 10 with 30 lbs Lateral band walks with yellow loop 3 laps of 15 feet Seated clamshell with yellow loop x 20 Side lying clam 2 x 10 no resistance Side lying reverse clam 2 x 10 Supine iron cross x 2 each side with PT assistance to support weight of the leg holding 30 seconds Ice to left lower back x 10 min   PATIENT EDUCATION:  Education details: Initiated HEP Person educated: Patient Education method: Programmer, multimedia, Facilities manager, Verbal cues, and Handouts Education comprehension: verbalized understanding, returned demonstration, and verbal cues required  HOME EXERCISE PROGRAM: Access Code: [facility] URL: [email or web address] Date:  06/29/2023 Prepared by: Minor Amble Beuhring  Exercises - Standing Hamstring Stretch on Chair  - 1 x daily - 7 x weekly - 1 sets - 3 reps - 30 sec  hold - Theatre manager with Chair and Counter Support  - 1 x daily - 7 x weekly - 1 sets - 3 reps - 30 sec hold - Seated Figure 4 Piriformis Stretch  - 1 x daily - 7 x weekly - 1 sets - 3 reps - 30 sed hold - Half Kneel Dynamic Hamstrings and Contralateral Hip Flexor Stretch  - 1 x daily - 7 x weekly - 1 sets - 3 reps - 15 hold - Half Kneeling Diagonal Chops with Medicine Ball  - Opposite Forward Leg  - 1 x daily - 7 x weekly - 1 sets - 8 reps - Supine Posterior Pelvic Tilt  - 1 x daily - 7 x weekly - 1 sets - 10 reps - Supine 90/90 Alternating Heel Touches with Posterior Pelvic Tilt  - 1 x daily - 7 x weekly - 1 sets - 10 reps  ASSESSMENT:  CLINICAL IMPRESSION: Norma Deleon presents to therapy with no currently pain, but she verbalized that the pain has moved to her right side now when she is sleeping. Demonstrated good carryout with breathing technique with bed mobility exercises. Overall, patient tolerated treatment session well. PT monitored throughout and provided verbal and visual cues as needed. Exercise intensity was kept lower today due to patient feeling tired. Patient will benefit from skilled PT to address the below impairments and improve overall function.     OBJECTIVE IMPAIRMENTS: difficulty walking, decreased ROM, decreased strength, increased fascial restrictions, increased muscle spasms, impaired flexibility, postural dysfunction, and pain.   ACTIVITY LIMITATIONS: carrying, lifting, bending, standing, squatting, sleeping, stairs, transfers, bed mobility, bathing, dressing, and caring for others  PARTICIPATION LIMITATIONS: meal prep, cleaning, laundry, driving, shopping, community activity, occupation, and yard work  PERSONAL FACTORS: Fitness, Past/current experiences, and 1 comorbidity: pregnancy are also affecting patient's functional  outcome.   REHAB POTENTIAL: Excellent  CLINICAL DECISION MAKING: Stable/uncomplicated  EVALUATION COMPLEXITY: Low   GOALS: Goals reviewed with patient? Yes  SHORT TERM GOALS: Target date: 07/20/2023  Pain report to be no greater than 4/10  Baseline: Goal status: MET 07/19/23  2.  Patient will be independent with initial HEP  Baseline:  Goal status: MET 07/19/23   LONG TERM GOALS: Target date: 08/17/2023  Patient to report pain no greater than 2/10  Baseline:  Goal status: In progress  2.  Patient to be independent with advanced HEP  Baseline:  Goal status: In progress  3.  ODI to be 24% or less Baseline:  Goal status: MET 07/19/23  4.  Patient to be able to stand or walk for at least 15 min without hip pain  Baseline:  Goal status: In progress  5.  Patient to be able to bend, stoop and squat with pain no greater than 2/10  Baseline:  Goal status: In progress  6.  Functional tests to improve by 2-3 seconds Baseline:  Goal status: MET 07/19/23  PLAN:  PT FREQUENCY: 1-2x/week  PT DURATION: 8 weeks  PLANNED INTERVENTIONS: 97110-Therapeutic exercises, 97530- Therapeutic activity, 97112- Neuromuscular re-education, 97535- Self Care, 54098- Manual therapy, [phone]- Gait training, [phone]- Aquatic Therapy, Patient/Family education, Stair training, Taping, Dry Needling, Cryotherapy, and Moist heat.  PLAN FOR NEXT SESSION:update insurance for next session:[employer] approved 6 visits 08/03/2023 - 10/01/2023 auth#[id number]   Assess tolerance to treatment session continue core and pelvic floor strengthening  Penelope Bowie, PT 08/02/23 2:08 PM [facility] [address] [place], [place] [address] Phone # [phone] Fax [phone]

## 2023-08-07 ENCOUNTER — Ambulatory Visit

## 2023-08-07 DIAGNOSIS — R262 Difficulty in walking, not elsewhere classified: Secondary | ICD-10-CM

## 2023-08-07 DIAGNOSIS — M25552 Pain in left hip: Secondary | ICD-10-CM

## 2023-08-07 DIAGNOSIS — M6281 Muscle weakness (generalized): Secondary | ICD-10-CM

## 2023-08-07 DIAGNOSIS — R293 Abnormal posture: Secondary | ICD-10-CM

## 2023-08-07 DIAGNOSIS — M5459 Other low back pain: Secondary | ICD-10-CM

## 2023-08-07 DIAGNOSIS — R252 Cramp and spasm: Secondary | ICD-10-CM

## 2023-08-07 NOTE — Therapy (Signed)
 OUTPATIENT PHYSICAL THERAPY THORACOLUMBAR TREATMENT   Patient Name: Norma Deleon MRN: [id number] DOB:Dec 26, 2000, 23 y.o., female Today's Date: 08/07/2023  END OF SESSION:  PT End of Session - 08/07/23 0846     Visit Number 8    Number of Visits 12    Date for PT Re-Evaluation 08/17/23    Authorization Type Medicaid  [place] [employer] Approved 6 visits-07/02/2023-7/02/20/2023-auth#[id number]    Authorization Time Period 6 more visits auth'd 6/20 thru 8/18    Authorization - Visit Number 2    Authorization - Number of Visits 6    Progress Note Due on Visit 10    PT Start Time 0843    PT Stop Time 0932    PT Time Calculation (min) 49 min    Activity Tolerance Patient tolerated treatment well    Behavior During Therapy [facility] for tasks assessed/performed              History reviewed. No pertinent past medical history. Past Surgical History:  Procedure Laterality Date   NO PAST SURGERIES     Patient Active Problem List   Diagnosis Date Noted   Group B streptococcal bacteriuria 06/12/2023   Supervision of normal pregnancy 03/05/2023   Overweight BMI=28.7 04/19/2021    PCP: PCP - General   REFERRING PROVIDER: Herchel Gloris LABOR, MD  REFERRING DIAG: O99.891,M54.9 (ICD-10-CM) - Back pain in pregnancy O26.899,R10.2 (ICD-10-CM) - Pelvic pain in pregnancy  Rationale for Evaluation and Treatment: Rehabilitation  THERAPY DIAG:  Other low back pain  Pain in left hip  Muscle weakness (generalized)  Difficulty in walking, not elsewhere classified  Cramp and spasm  Abnormal posture  ONSET DATE: 06/12/2023  SUBJECTIVE:                                                                                                                                                                                           SUBJECTIVE STATEMENT: Patient reports she is doing good once she is up moving but still having trouble getting comfortable at night.  I'm to the point of crying  because I can't get any sleep  PERTINENT HISTORY:  na  PAIN:  08/07/23 Are you having pain? Yes: NPRS scale: 0/10  Pain location: low back , hips and pelvic floor Pain description: aching Aggravating factors: sleeping, standing for prolonged periods of time Relieving factors: stretching, walking and typically a hot bath but has not been able to do this due to pregnancy  PRECAUTIONS: Other: pregnancy  RED FLAGS: None   WEIGHT BEARING RESTRICTIONS: No  FALLS:  Has patient fallen in last 6 months? No  LIVING ENVIRONMENT: Lives with:  lives with their family Lives in: House/apartment   OCCUPATION: ask next visit  PLOF: Independent, Independent with basic ADLs, Independent with household mobility without device, Independent with community mobility without device, Independent with gait, and Independent with transfers  PATIENT GOALS: to get some relief of the pain so that I can continue a normal pregnancy and not be on bed rest  NEXT MD VISIT: prn  OBJECTIVE:  Note: Objective measures were completed at Evaluation unless otherwise noted.  DIAGNOSTIC FINDINGS:  na  PATIENT SURVEYS:  Initial eval: Modified Oswestry 17/50=34%   07/19/23: Modified Oswestry 12/50=24%   COGNITION: Overall cognitive status: Within functional limits for tasks assessed     SENSATION: WFL  MUSCLE LENGTH: Hamstrings: Right 55 deg; Left 50 deg Thomas test: Right neg Left neg  POSTURE: increased lumbar lordosis  PALPATION: Symmetry throughout, slightly tender   LUMBAR ROM:   All WNL  LOWER EXTREMITY ROM:     Hip IR and ER limited bilaterally.  All others WNL  LOWER EXTREMITY MMT:    Generally 4 to 4+/5 bilaterally  LUMBAR SPECIAL TESTS:  Straight leg raise test: Negative  FUNCTIONAL TESTS:   Initial eval: 5 times sit to stand: 11.01 sec Timed up and go (TUG): 8.46 sec  07/19/23: 5 times sit to stand: 8.93 sec Timed up and go (TUG): 5.47 sec  GAIT: Distance walked: 50  feet Assistive device utilized: None Level of assistance: Complete Independence Comments: guarded antalgic  TREATMENT DATE:  08/07/23 NuStep L3 x 5' (PT present to discuss status) Piriformis stretch (seated) 3 x 30 sec Hook lying lower trunk rotation x 20 Hook lying combo hip IR and ER stretch x 20 Iron cross stretch with green loop 2 x 20sec bilateral  Patient had mentioned some patellofemoral discomfort, added quad sets, SAQ and SLR x 20 each Educated on how to control PF pain/alignment and importance of hip strength Hook lying clam x 20 with red loop  Side lying clam 2 x 10 red loop Side lying reverse clam 2 x 10 Quadruped TA activation 2 x 10 Quadruped slow rock x 5 each direction (fwd/back) Seated QL stretch with peanut stability ball 3 x 10 sec bilateral  Seated clamshell with black loop x 20 (black loop next time) Hamstring stretch at mat table with balance poles 2 x 30 sec bilateral  Ice to left lower back x 10 min  08/02/23 NuStep L3 x 5' (PT present to discuss status) Piriformis stretch (seated) 3 x 30 sec Hamstring stretch at stair 2 x 30 sec bilateral  Seated QL stretch with peanut stability ball 3 x 10 sec bilateral  Quadruped TA activation 2 x 10 Quadruped slow rock x 5 each direction Side lying clam 2 x 10 no resistance Side lying reverse clam 2 x 10 Seated clamshell with black loop x 20 (black loop next time) Pallof Press with long red loop 2 x 15 each direction Ice to left lower back x 10 min   07/26/23 NuStep L5 x 5' (PT present to discuss status) Piriformis stretch (seated) 3 x 30 sec Iron cross stretch with green loop 2 x 20sec bilateral  Side lying clam 2 x 10 no resistance Side lying reverse clam 2 x 10 Education on breathing technique and core engagement when changing positions on plinth Seated clamshell with red loop 2 x 20 (black loop next time) Quadruped TA activation 2 x 10 Pallof Press with long red loop x 20 each direction Hip matrix: hip  abduction and  extension 2 x 10 with 30 lbs Ice to left lower back x 10 min  PATIENT EDUCATION:  Education details: Initiated HEP Person educated: Patient Education method: Programmer, multimedia, Facilities manager, Verbal cues, and Handouts Education comprehension: verbalized understanding, returned demonstration, and verbal cues required  HOME EXERCISE PROGRAM: Access Code: [facility] URL: [email or web address] Date: 06/29/2023 Prepared by: Orvil Beuhring  Exercises - Standing Hamstring Stretch on Chair  - 1 x daily - 7 x weekly - 1 sets - 3 reps - 30 sec hold - Quadricep Stretch with Chair and Counter Support  - 1 x daily - 7 x weekly - 1 sets - 3 reps - 30 sec hold - Seated Figure 4 Piriformis Stretch  - 1 x daily - 7 x weekly - 1 sets - 3 reps - 30 sed hold - Half Kneel Dynamic Hamstrings and Contralateral Hip Flexor Stretch  - 1 x daily - 7 x weekly - 1 sets - 3 reps - 15 hold - Half Kneeling Diagonal Chops with Medicine Ball  - Opposite Forward Leg  - 1 x daily - 7 x weekly - 1 sets - 8 reps - Supine Posterior Pelvic Tilt  - 1 x daily - 7 x weekly - 1 sets - 10 reps - Supine 90/90 Alternating Heel Touches with Posterior Pelvic Tilt  - 1 x daily - 7 x weekly - 1 sets - 10 reps  ASSESSMENT:  CLINICAL IMPRESSION: Chaunda continues to have some pain with trying to lie on her side when sleeping and is not sleeping well.  She has very little pain once she is up and moving and states she feels good once she is up.  She is very compliant and motivated.   Patient will benefit from skilled PT to address the below impairments and improve overall function.  OBJECTIVE IMPAIRMENTS: difficulty walking, decreased ROM, decreased strength, increased fascial restrictions, increased muscle spasms, impaired flexibility, postural dysfunction, and pain.   ACTIVITY LIMITATIONS: carrying, lifting, bending, standing, squatting, sleeping, stairs, transfers, bed mobility, bathing, dressing, and caring for  others  PARTICIPATION LIMITATIONS: meal prep, cleaning, laundry, driving, shopping, community activity, occupation, and yard work  PERSONAL FACTORS: Fitness, Past/current experiences, and 1 comorbidity: pregnancy are also affecting patient's functional outcome.   REHAB POTENTIAL: Excellent  CLINICAL DECISION MAKING: Stable/uncomplicated  EVALUATION COMPLEXITY: Low   GOALS: Goals reviewed with patient? Yes  SHORT TERM GOALS: Target date: 07/20/2023  Pain report to be no greater than 4/10  Baseline: Goal status: MET 07/19/23  2.  Patient will be independent with initial HEP  Baseline:  Goal status: MET 07/19/23   LONG TERM GOALS: Target date: 08/17/2023  Patient to report pain no greater than 2/10  Baseline:  Goal status: In progress  2.  Patient to be independent with advanced HEP  Baseline:  Goal status: In progress  3.  ODI to be 24% or less Baseline:  Goal status: MET 07/19/23  4.  Patient to be able to stand or walk for at least 15 min without hip pain  Baseline:  Goal status: In progress  5.  Patient to be able to bend, stoop and squat with pain no greater than 2/10  Baseline:  Goal status: In progress  6.  Functional tests to improve by 2-3 seconds Baseline:  Goal status: MET 07/19/23  PLAN:  PT FREQUENCY: 1-2x/week  PT DURATION: 8 weeks  PLANNED INTERVENTIONS: 97110-Therapeutic exercises, 97530- Therapeutic activity, 97112- Neuromuscular re-education, 97535- Self Care, 02859- Manual therapy, [id number]- Gait training, [phone]- Aquatic Therapy,  Patient/Family education, Stair training, Taping, Dry Needling, Cryotherapy, and Moist heat.  PLAN FOR NEXT SESSION:  [employer] approved 6 visits 08/03/2023 - 10/01/2023 auth#[id number]   Continue core strengthening, TA and mobility training.  Ice after sessions.    Delon B. Lorcan Shelp, PT 08/07/23 4:34 PM [facility] [address] [place], [place] [address] Phone # [phone] Fax  [phone]

## 2023-08-08 ENCOUNTER — Ambulatory Visit: Admitting: Certified Nurse Midwife

## 2023-08-08 VITALS — BP 123/76 | HR 90 | Wt 198.0 lb

## 2023-08-08 DIAGNOSIS — Z3A32 32 weeks gestation of pregnancy: Secondary | ICD-10-CM | POA: Diagnosis not present

## 2023-08-08 DIAGNOSIS — Z3493 Encounter for supervision of normal pregnancy, unspecified, third trimester: Secondary | ICD-10-CM | POA: Diagnosis not present

## 2023-08-08 DIAGNOSIS — G4700 Insomnia, unspecified: Secondary | ICD-10-CM | POA: Diagnosis not present

## 2023-08-08 NOTE — Progress Notes (Signed)
 PRENATAL VISIT NOTE  Subjective:  Norma Deleon is a 23 y.o. G1P0000 at [redacted]w[redacted]d being seen today for ongoing prenatal care.  She is currently monitored for the following issues for this low-risk pregnancy and has Overweight BMI=28.7; Supervision of normal pregnancy; and Group B streptococcal bacteriuria on their problem list.  Patient reports insomnia.  Contractions: Irritability. Vag. Bleeding: None.  Movement: Present. Denies leaking of fluid.   The following portions of the patient's history were reviewed and updated as appropriate: allergies, current medications, past family history, past medical history, past social history, past surgical history and problem list.   Objective:    Vitals:   08/08/23 1056  BP: 123/76  Pulse: 90  Weight: 198 lb (89.8 kg)    Fetal Status:  Fetal Heart Rate (bpm): 153   Movement: Present    General: Alert, oriented and cooperative. Patient is in no acute distress.  Skin: Skin is warm and dry. No rash noted.   Cardiovascular: Normal heart rate noted  Respiratory: Normal respiratory effort, no problems with respiration noted  Abdomen: Soft, gravid, appropriate for gestational age.  Pain/Pressure: Present     Pelvic: Cervical exam deferred        Extremities: Normal range of motion.  Edema: Trace  Mental Status: Normal mood and affect. Normal behavior. Normal judgment and thought content.   Assessment and Plan:  Pregnancy: G1P0000 at [redacted]w[redacted]d 1. Encounter for supervision of low-risk pregnancy in third trimester (Primary) - Patient doing well.  - Reports vigorous and frequent fetal movement   2. [redacted] weeks gestation of pregnancy - Reviewed 3rd trimester expectations.  - Incomplete heart views on previous US , never followed up. Order re-ordered and C. Soliz notified.   3. Insomnia, unspecified type - recommended PO Magnesium supplements for rest as well as lavender baths.  - Pt interested in waterbirth and has attended the class.  - Reviewed  conditions in labor that will risk her out of water immersion including thick meconium or blood stained amniotic fluid, non-reassuring fetal status on monitor, excessive bleeding, hypertension, dizziness, use of IV meds, damaged equipment or staffing that does not allow for water immersion, etc.  - The attending midwife must be on the unit for water immersion to begin; pt understands this may delay the start of water immersion. - Reminded pt that signing consent in labor at the hospital also acknowledges they will exit the tub if the attending midwife requests. - Consent given to patient for review.  Consent will be reviewed and signed at the hospital by the waterbirth provider prior to use of the tub. - Discussed other labor support options if waterbirth becomes unavailable, including position change, freedom of movement, use of birthing ball, and/or use of hydrotherapy in the shower (dependent upon medical condition/provider discretion).    Preterm labor symptoms and general obstetric precautions including but not limited to vaginal bleeding, contractions, leaking of fluid and fetal movement were reviewed in detail with the patient. Please refer to After Visit Summary for other counseling recommendations.   Return in about 2 weeks (around 08/22/2023) for LOB.  Future Appointments  Date Time Provider Department Center  08/16/2023 10:15 AM Janit Paris, PT [facility] None  08/22/2023 11:15 AM Anyanwu, Gloris LABOR, MD [facility]  08/23/2023 10:15 AM Janit Paris, PT [facility] None  09/05/2023 11:15 AM Letha Renshaw, CNM [facility]  09/12/2023 11:15 AM Emilio Delilah HERO, CNM [facility]    Lujean Ebright Erven) Emilio, MSN, CNM  Center [facility]  08/08/2023 11:27 AM

## 2023-08-08 NOTE — Addendum Note (Signed)
 Addended by: Yulisa Chirico on: 08/08/2023 12:58 PM   Modules accepted: Orders

## 2023-08-08 NOTE — Progress Notes (Signed)
ROB   CC: none    

## 2023-08-09 ENCOUNTER — Encounter

## 2023-08-09 ENCOUNTER — Encounter: Payer: Self-pay | Admitting: Certified Nurse Midwife

## 2023-08-12 ENCOUNTER — Encounter (HOSPITAL_COMMUNITY): Payer: Self-pay | Admitting: Obstetrics & Gynecology

## 2023-08-12 ENCOUNTER — Inpatient Hospital Stay (HOSPITAL_COMMUNITY)
Admission: AD | Admit: 2023-08-12 | Discharge: 2023-08-12 | Disposition: A | Discharge status: Home / Self Care | Attending: Obstetrics & Gynecology | Admitting: Obstetrics & Gynecology

## 2023-08-12 DIAGNOSIS — O26893 Other specified pregnancy related conditions, third trimester: Secondary | ICD-10-CM

## 2023-08-12 DIAGNOSIS — O9A213 Injury, poisoning and certain other consequences of external causes complicating pregnancy, third trimester: Secondary | ICD-10-CM | POA: Diagnosis present

## 2023-08-12 DIAGNOSIS — O99891 Other specified diseases and conditions complicating pregnancy: Secondary | ICD-10-CM | POA: Diagnosis not present

## 2023-08-12 DIAGNOSIS — Z3A32 32 weeks gestation of pregnancy: Secondary | ICD-10-CM

## 2023-08-12 DIAGNOSIS — M545 Low back pain, unspecified: Secondary | ICD-10-CM | POA: Diagnosis present

## 2023-08-12 DIAGNOSIS — R030 Elevated blood-pressure reading, without diagnosis of hypertension: Secondary | ICD-10-CM | POA: Diagnosis not present

## 2023-08-12 LAB — COMPREHENSIVE METABOLIC PANEL WITH GFR
ALT: 15 U/L (ref 0–44)
AST: 21 U/L (ref 15–41)
Albumin: 2.7 g/dL — ABNORMAL LOW (ref 3.5–5.0)
Alkaline Phosphatase: 67 U/L (ref 38–126)
Anion gap: 9 (ref 5–15)
BUN: 7 mg/dL (ref 6–20)
CO2: 21 mmol/L — ABNORMAL LOW (ref 22–32)
Calcium: 8.8 mg/dL — ABNORMAL LOW (ref 8.9–10.3)
Chloride: 104 mmol/L (ref 98–111)
Creatinine, Ser: 0.72 mg/dL (ref 0.44–1.00)
GFR, Estimated: >60 mL/min (ref >60)
Glucose, Bld: 77 mg/dL (ref 70–99)
Potassium: 3.9 mmol/L (ref 3.5–5.1)
Sodium: 134 mmol/L — ABNORMAL LOW (ref 135–145)
Total Bilirubin: 0.6 mg/dL (ref 0.0–1.2)
Total Protein: 6.1 g/dL — ABNORMAL LOW (ref 6.5–8.1)

## 2023-08-12 LAB — PROTEIN / CREATININE RATIO, URINE
Creatinine, Urine: 50 mg/dL
Total Protein, Urine: <6 mg/dL

## 2023-08-12 LAB — CBC
HCT: 33.5 % — ABNORMAL LOW (ref 36.0–46.0)
Hemoglobin: 10.9 g/dL — ABNORMAL LOW (ref 12.0–15.0)
MCH: 28.8 pg (ref 26.0–34.0)
MCHC: 32.5 g/dL (ref 30.0–36.0)
MCV: 88.4 fL (ref 80.0–100.0)
Platelets: 243 10*3/uL (ref 150–400)
RBC: 3.79 MIL/uL — ABNORMAL LOW (ref 3.87–5.11)
RDW: 13.5 % (ref 11.5–15.5)
WBC: 15.3 10*3/uL — ABNORMAL HIGH (ref 4.0–10.5)
nRBC: 0 % (ref 0.0–0.2)
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
## 2023-08-16 ENCOUNTER — Ambulatory Visit (INDEPENDENT_AMBULATORY_CARE_PROVIDER_SITE_OTHER): Admitting: *Deleted

## 2023-08-16 ENCOUNTER — Ambulatory Visit: Attending: Obstetrics & Gynecology | Admitting: Physical Therapy

## 2023-08-16 ENCOUNTER — Encounter: Payer: Self-pay | Admitting: Physical Therapy

## 2023-08-16 VITALS — BP 121/78 | HR 89

## 2023-08-16 DIAGNOSIS — M5459 Other low back pain: Secondary | ICD-10-CM | POA: Diagnosis present

## 2023-08-16 DIAGNOSIS — R252 Cramp and spasm: Secondary | ICD-10-CM | POA: Insufficient documentation

## 2023-08-16 DIAGNOSIS — M6281 Muscle weakness (generalized): Secondary | ICD-10-CM | POA: Diagnosis present

## 2023-08-16 DIAGNOSIS — M25552 Pain in left hip: Secondary | ICD-10-CM | POA: Diagnosis present

## 2023-08-16 DIAGNOSIS — R262 Difficulty in walking, not elsewhere classified: Secondary | ICD-10-CM | POA: Diagnosis present

## 2023-08-16 DIAGNOSIS — R293 Abnormal posture: Secondary | ICD-10-CM | POA: Diagnosis present

## 2023-08-16 DIAGNOSIS — R030 Elevated blood-pressure reading, without diagnosis of hypertension: Secondary | ICD-10-CM

## 2023-08-16 DIAGNOSIS — Z0130 Encounter for examination of blood pressure without abnormal findings: Secondary | ICD-10-CM
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
## 2023-08-16 NOTE — Progress Notes (Signed)
 Subjective:  Norma Deleon is a 23 y.o. female here for BP check.   Hypertension ROS: Patient denies any headaches, visual symptoms, RUQ/epigastric pain or other concerning symptoms.  Objective:  BP 121/78   Pulse 89   LMP 12/26/2022 (Exact Date)   Appearance alert, well appearing, and in no distress. General exam BP noted to be normal today in office.    Assessment:   Blood Pressure well controlled.   Plan:  Follow you up at [facility].   Wanda Buckles, RN

## 2023-08-21 ENCOUNTER — Ambulatory Visit: Attending: Certified Nurse Midwife

## 2023-08-21 ENCOUNTER — Ambulatory Visit (HOSPITAL_BASED_OUTPATIENT_CLINIC_OR_DEPARTMENT_OTHER): Admitting: Obstetrics

## 2023-08-21 VITALS — BP 133/72

## 2023-08-21 DIAGNOSIS — O358XX0 Maternal care for other (suspected) fetal abnormality and damage, not applicable or unspecified: Secondary | ICD-10-CM | POA: Diagnosis not present

## 2023-08-21 DIAGNOSIS — Z3A34 34 weeks gestation of pregnancy: Secondary | ICD-10-CM | POA: Diagnosis not present

## 2023-08-21 DIAGNOSIS — R8271 Bacteriuria: Secondary | ICD-10-CM | POA: Insufficient documentation

## 2023-08-21 DIAGNOSIS — Z3493 Encounter for supervision of normal pregnancy, unspecified, third trimester: Secondary | ICD-10-CM | POA: Diagnosis not present

## 2023-08-21 DIAGNOSIS — O133 Gestational [pregnancy-induced] hypertension without significant proteinuria, third trimester: Secondary | ICD-10-CM | POA: Insufficient documentation

## 2023-08-21 DIAGNOSIS — R030 Elevated blood-pressure reading, without diagnosis of hypertension: Secondary | ICD-10-CM

## 2023-08-21 DIAGNOSIS — Z3403 Encounter for supervision of normal first pregnancy, third trimester: Secondary | ICD-10-CM

## 2023-08-21 DIAGNOSIS — Z363 Encounter for antenatal screening for malformations: Secondary | ICD-10-CM | POA: Insufficient documentation

## 2023-08-21 DIAGNOSIS — Z3A32 32 weeks gestation of pregnancy: Secondary | ICD-10-CM | POA: Insufficient documentation

## 2023-08-21 NOTE — Progress Notes (Signed)
 MFM Consult Note  Norma Deleon is currently at 34 weeks and 0 days.  She was seen for a detailed fetal anatomy scan.  She denies any significant past medical history and denies any problems in her current pregnancy.    She had a cell free DNA test earlier in her pregnancy which indicated a low risk for trisomy 77, 13, and 13. A female fetus is predicted.   On today's exam, the overall EFW of 5 pounds 6 ounces measures at the 58th percentile for her gestational age.    There was normal amniotic fluid noted with a total AFI of 14.33 cm.    The fetus was in the vertex presentation.  There were no obvious fetal anomalies noted on today's ultrasound exam.  However, today's exam was limited due to the fetal position and her advanced gestational age.  The patient was informed that anomalies may be missed due to technical limitations. If the fetus is in a suboptimal position or maternal habitus is increased, visualization of the fetus in the maternal uterus may be impaired.  As the fetal growth is within normal limits, no further exams were scheduled in our office.    The patient stated that all of her questions were answered today.  A total of 30 minutes was spent counseling and coordinating the care for this patient.  Greater than 50% of the time was spent in direct face-to-face contact.

## 2023-08-22 ENCOUNTER — Encounter: Payer: Self-pay | Admitting: Obstetrics & Gynecology

## 2023-08-22 ENCOUNTER — Ambulatory Visit (INDEPENDENT_AMBULATORY_CARE_PROVIDER_SITE_OTHER): Admitting: Obstetrics & Gynecology

## 2023-08-22 VITALS — BP 121/75 | HR 84 | Wt 201.0 lb

## 2023-08-22 DIAGNOSIS — Z3493 Encounter for supervision of normal pregnancy, unspecified, third trimester: Secondary | ICD-10-CM | POA: Diagnosis not present

## 2023-08-22 DIAGNOSIS — Z3A34 34 weeks gestation of pregnancy: Secondary | ICD-10-CM

## 2023-08-22 NOTE — Patient Instructions (Signed)

## 2023-08-22 NOTE — Progress Notes (Signed)
 PRENATAL VISIT NOTE  Subjective:  Norma Deleon is a 23 y.o. G1P0000 at [redacted]w[redacted]d being seen today for ongoing prenatal care.  She is currently monitored for the following issues for this low-risk pregnancy and has Overweight BMI=28.7; Supervision of normal pregnancy; Group B streptococcal bacteriuria; and Elevated BP without diagnosis of hypertension on their problem list.  Patient reports no complaints.  Contractions: Irregular. Vag. Bleeding: None.  Movement: Present. Denies leaking of fluid.   The following portions of the patient's history were reviewed and updated as appropriate: allergies, current medications, past family history, past medical history, past social history, past surgical history and problem list.   Objective:    Vitals:   08/22/23 1117  BP: 121/75  Pulse: 84  Weight: 201 lb (91.2 kg)    Fetal Status:  Fetal Heart Rate (bpm): 169 Fundal Height: 35 cm Movement: Present    General: Alert, oriented and cooperative. Patient is in no acute distress.  Skin: Skin is warm and dry. No rash noted.   Cardiovascular: Normal heart rate noted  Respiratory: Normal respiratory effort, no problems with respiration noted  Abdomen: Soft, gravid, appropriate for gestational age.  Pain/Pressure: Present     Pelvic: Cervical exam deferred        Extremities: Normal range of motion.  Edema: Trace  Mental Status: Normal mood and affect. Normal behavior. Normal judgment and thought content.    US  MFM OB COMP + 14 WK Result Date: 08/21/2023 ----------------------------------------------------------------------  OBSTETRICS REPORT                       (Signed Final 08/21/2023 10:07 am) ---------------------------------------------------------------------- Patient Info  ID #:       [id number]                          D.O.B.:  August 03, 2000 (22 yrs)(F)  Name:       Norma Deleon                  Visit Date: 08/21/2023 07:34 am ----------------------------------------------------------------------  Performed By  Attending:        Steffan Keys MD         Ref. Address:     [address]                                                             [address]  Performed By:     Joyann Allen RDMS      [facility]  Referred By:      [facility] ---------------------------------------------------------------------- Orders  #  Description                           Code        Ordered By  1  US  MFM OB COMP + 14 WK                [id number]    SHANTONETTE                                                       PAYNE ----------------------------------------------------------------------  #  Order #                     Accession #                Episode #  1  [id number]                   [id number]                 [id number] ---------------------------------------------------------------------- Indications  Elevated blood pressure affecting pregnancy    O13.3  in third trimester  [redacted] weeks gestation of pregnancy                Z3A.34  Encounter for antenatal screening for          Z36.3  malformations  Low Risk NIPS Neg Horizon ---------------------------------------------------------------------- Vital Signs  BP:          132/72 ---------------------------------------------------------------------- Fetal Evaluation  Num Of Fetuses:         1  Fetal Heart Rate(bpm):  154  Cardiac Activity:       Observed  Presentation:           Cephalic  Placenta:               Anterior  P. Cord Insertion:      Visualized, central  Amniotic Fluid  AFI FV:      Within normal limits  AFI Sum(cm)     %Tile       Largest Pocket(cm)  14.33           51          3.88  RUQ(cm)       RLQ(cm)       LUQ(cm)        LLQ(cm)  3.54          3.26          3.65           3.88  ---------------------------------------------------------------------- Biometry  BPD:      89.5  mm     G. Age:  36w 2d         95  %    CI:        76.41   %    70 - 86  FL/HC:      21.2   %    19.4 - 21.8  HC:      324.4  mm     G. Age:  36w 5d         81  %    HC/AC:      1.11        0.96 - 1.11  AC:      292.2  mm     G. Age:  33w 2d         30  %    FL/BPD:     76.9   %    71 - 87  FL:       68.8  mm     G. Age:  35w 2d         75  %    FL/AC:      23.5   %    20 - 24  HUM:      59.5  mm     G. Age:  34w 4d         67  %  CER:      42.8  mm     G. Age:  33w 6d         26  %  LV:        5.4  mm  Est. FW:    2437  gm      5 lb 6 oz     58  % ---------------------------------------------------------------------- OB History  Blood Type:   O+  Gravidity:    1 ---------------------------------------------------------------------- Gestational Age  LMP:           34w 0d        Date:  12/26/22                 EDD:   10/02/23  U/S Today:     35w 3d                                        EDD:   09/22/23  Best:          34w 0d     Det. By:  LMP  (12/26/22)          EDD:   10/02/23 ---------------------------------------------------------------------- Targeted Anatomy  Central Nervous System  Calvarium/Cranial V.:  Appears normal         Cereb./Vermis:          Appears normal  Cavum:                 Appears normal         Cisterna Magna:         Appears normal  Lateral Ventricles:    Appears normal         Midline Falx:           Appears normal  Choroid Plexus:        Appears normal  Spine  Cervical:              Appears normal         Sacral:                 Not well visualized  Thoracic:              Appears normal         Shape/Curvature:  Appears normal  Lumbar:                Appears normal  Head/Neck  Lips:                  Appears normal         Profile:                Appears normal  Neck:                  Appears normal         Orbits/Eyes:            Appears  normal  Nuchal Fold:           Appears normal         Mandible:               Not well visualized  Nasal Bone:            Present                Maxilla:                Not well visualized  Thorax  4 Chamber View:        Appears normal         Interventr. Septum:     Not well visualized  Cardiac Rhythm:        Normal                 Cardiac Axis:           Normal  Cardiac Situs:         Appears normal         Diaphragm:              Not well visualized  Rt Outflow Tract:      Not well visualized    3 Vessel View:          Appears normal  Lt Outflow Tract:      Not well visualized    3 V Trachea View:       Appears normal  Aortic Arch:           Appears normal         IVC:                    Not well visualized  Ductal Arch:           Appears normal         Crossing:               Not well visualized  SVC:                   Not well visualized  Abdomen  Ventral Wall:          Not well visualized    Lt Kidney:              Appears normal  Cord Insertion:        Not well visualized    Rt Kidney:              Appears normal  Situs:                 Appears normal         Bladder:                Appears normal  Stomach:  Appears normal  Extremities  Lt Humerus:            Appears normal         Lt Femur:               Appears normal  Rt Humerus:            Appears normal         Rt Femur:               Appears normal  Lt Forearm:            Not well visualized    Lt Lower Leg:           Not well visualized  Rt Forearm:            Appears normal         Rt Lower Leg:           Appears normal  Lt Hand:               Not well visualized    Lt Foot:                Not well visualized  Rt Hand:               Not well visualized    Rt Foot:                Nml heel/foot  Other  Umbilical Cord:        Normal 3-vessel        Genitalia:              Female-nml  Comment:     Technically difficult due to gestational age. ---------------------------------------------------------------------- Cervix Uterus Adnexa  Cervix   Not visualized (advanced GA >24wks)  Uterus  No abnormality visualized.  Right Ovary  Not visualized.  Left Ovary  Size(cm)     3.42   x   1.81   x  1.07      Vol(ml): 3.47  Within normal limits.  Cul De Sac  No free fluid seen.  Adnexa  No abnormality visualized ---------------------------------------------------------------------- Comments  Norma RAMAN Bennett is currently at 34 weeks and 0 days.  She was  seen for a detailed fetal anatomy scan.  She denies any significant past medical history and denies  any problems in her current pregnancy.  She had a cell free DNA test earlier in her pregnancy which  indicated a low risk for trisomy 56, 73, and 13. A female fetus  is predicted.  On today's exam, the overall EFW of 5 pounds 6 ounces  measures at the 58th percentile for her gestational age.  There was normal amniotic fluid noted with a total AFI of  14.33 cm.  The fetus was in the vertex presentation.  There were no obvious fetal anomalies noted on today's  ultrasound exam.  However, today's exam was limited due to  the fetal position and her advanced gestational age.  The patient was informed that anomalies may be missed due  to technical limitations. If the fetus is in a suboptimal position  or maternal habitus is increased, visualization of the fetus in  the maternal uterus may be impaired.  As the fetal growth is within normal limits, no further exams  were scheduled in our office.  The patient stated that all of her questions were answered  today.  A total of 30 minutes was spent counseling and coordinating  the  care for this patient.  Greater than 50% of the time was  spent in direct face-to-face contact. ----------------------------------------------------------------------                  Steffan Keys, MD Electronically Signed Final Report   08/21/2023 10:07 am ----------------------------------------------------------------------    Assessment and Plan:  Pregnancy: G1P0000 at [redacted]w[redacted]d 1. [redacted] weeks gestation  of pregnancy 2. Encounter for supervision of low-risk pregnancy in third trimester (Primary) No concerns. Preterm labor symptoms and general obstetric precautions including but not limited to vaginal bleeding, contractions, leaking of fluid and fetal movement were reviewed in detail with the patient. Please refer to After Visit Summary for other counseling recommendations.   Return in about 2 weeks (around 09/05/2023) for Pelvic cultures, OFFICE OB VISIT (MD or APP).  Future Appointments  Date Time Provider Department Center  08/23/2023 10:15 AM Janit Paris, PT [facility] None  09/05/2023 11:15 AM Letha Renshaw, CNM [facility]  09/12/2023 11:15 AM Emilio Delilah HERO, CNM [facility]    Gloris Hugger, MD

## 2023-08-23 ENCOUNTER — Ambulatory Visit: Admitting: Physical Therapy

## 2023-08-23 ENCOUNTER — Encounter: Payer: Self-pay | Admitting: Physical Therapy

## 2023-08-23 DIAGNOSIS — M5459 Other low back pain: Secondary | ICD-10-CM | POA: Diagnosis not present

## 2023-08-23 DIAGNOSIS — R262 Difficulty in walking, not elsewhere classified: Secondary | ICD-10-CM

## 2023-08-23 DIAGNOSIS — M25552 Pain in left hip: Secondary | ICD-10-CM

## 2023-08-23 DIAGNOSIS — R252 Cramp and spasm: Secondary | ICD-10-CM

## 2023-08-23 DIAGNOSIS — R293 Abnormal posture: Secondary | ICD-10-CM

## 2023-08-23 DIAGNOSIS — M6281 Muscle weakness (generalized): Secondary | ICD-10-CM

## 2023-08-23 NOTE — Therapy (Signed)
 OUTPATIENT PHYSICAL THERAPY THORACOLUMBAR TREATMENT  Patient Name: Norma Deleon MRN: [id number] DOB:[birth year], 23 y.o., female Today's Date: 08/23/2023  END OF SESSION:  PT End of Session - 08/23/23 1114     Visit Number 10    Number of Visits 27    Date for PT Re-Evaluation 09/28/23    Authorization Type Medicaid  [place] [employer] Approved 6 visits-07/02/2023-7/02/20/2023-auth#[id number]    Authorization Time Period [employer] approved 6 visits 08/03/2023 - 10/01/2023 auth#[id number]    Authorization - Visit Number 4    Authorization - Number of Visits 6    Progress Note Due on Visit 10    PT Start Time 1016    PT Stop Time 1106    PT Time Calculation (min) 50 min    Activity Tolerance Patient tolerated treatment well    Behavior During Therapy [facility] for tasks assessed/performed                History reviewed. No pertinent past medical history. Past Surgical History:  Procedure Laterality Date   NO PAST SURGERIES     Patient Active Problem List   Diagnosis Date Noted   Elevated BP without diagnosis of hypertension 08/12/2023   Group B streptococcal bacteriuria 06/12/2023   Supervision of normal pregnancy 03/05/2023   Overweight BMI=28.7 04/19/2021    PCP: PCP - General   REFERRING PROVIDER: Herchel Gloris LABOR, MD  REFERRING DIAG: O99.891,M54.9 (ICD-10-CM) - Back pain in pregnancy O26.899,R10.2 (ICD-10-CM) - Pelvic pain in pregnancy  Rationale for Evaluation and Treatment: Rehabilitation  THERAPY DIAG:  Other low back pain  Pain in left hip  Muscle weakness (generalized)  Difficulty in walking, not elsewhere classified  Cramp and spasm  Abnormal posture  ONSET DATE: 06/12/2023  SUBJECTIVE:                                                                                                                                                                                           SUBJECTIVE STATEMENT: Patient reports she is doing good today. Her neck and  back are feeling a lot better.  PERTINENT HISTORY:  na  PAIN:  08/23/23 Are you having pain? Yes: NPRS scale: 0/10  Pain location: low back , hips and pelvic floor Pain description: aching Aggravating factors: sleeping, standing for prolonged periods of time Relieving factors: stretching, walking and typically a hot bath but has not been able to do this due to pregnancy  PRECAUTIONS: Other: pregnancy  RED FLAGS: None   WEIGHT BEARING RESTRICTIONS: No  FALLS:  Has patient fallen in last 6 months? No  LIVING ENVIRONMENT: Lives with: lives with their family Lives  in: House/apartment   OCCUPATION: ask next visit  PLOF: Independent, Independent with basic ADLs, Independent with household mobility without device, Independent with community mobility without device, Independent with gait, and Independent with transfers  PATIENT GOALS: to get some relief of the pain so that I can continue a normal pregnancy and not be on bed rest  NEXT MD VISIT: prn  OBJECTIVE:  Note: Objective measures were completed at Evaluation unless otherwise noted.  DIAGNOSTIC FINDINGS:  na  PATIENT SURVEYS:  Initial eval: Modified Oswestry 17/50=34%   07/19/23: Modified Oswestry 12/50=24%    COGNITION: Overall cognitive status: Within functional limits for tasks assessed     SENSATION: WFL  MUSCLE LENGTH: Hamstrings: Right 55 deg; Left 50 deg Thomas test: Right neg Left neg  POSTURE: increased lumbar lordosis  PALPATION: Symmetry throughout, slightly tender   LUMBAR ROM:   All WNL  LOWER EXTREMITY ROM:     Hip IR and ER limited bilaterally.  All others WNL  LOWER EXTREMITY MMT:    Generally 4 to 4+/5 bilaterally  LUMBAR SPECIAL TESTS:  Straight leg raise test: Negative  FUNCTIONAL TESTS:   Initial eval: 5 times sit to stand: 11.01 sec Timed up and go (TUG): 8.46 sec  07/19/23: 5 times sit to stand: 8.93 sec Timed up and go (TUG): 5.47 sec  GAIT: Distance walked: 50  feet Assistive device utilized: None Level of assistance: Complete Independence Comments: guarded antalgic  TREATMENT DATE:  08/23/23 NuStep L5 x 5' (PT present to discuss status) Child Pose 2 x 30 Quadruped slow rock x 10 each direction  Pigeon Pose 2 x 30 sec bilateral  Deep Squat with pelvic floor relaxation 2 x 30sec Open Books x 10 bilateral  Sidelying clamshell x 12 bilateral  Sidelying reverse clamshell x 12 bilateral  Supine hip internal rotation stretch  x 30 sec Supine hip internal/ external rotation x 10 each side Seated on stability ball: pelvic tilts, circles x 20 each direction Seated clamshell with black loop x 20 Ice to left lower back x 10 min    08/16/23 NuStep L5 x 5' (PT present to discuss status) Seated 3 way stability ball stretch x 5 each direction Hook lying lower trunk rotation x 20 total Hook lying combo hip IR and ER stretch x 20 Quadruped slow rock x 5 each direction (fwd/back) Childs Pose 3 x 20 sec Child's Pose + sidebend x  20 each each direction  Cat Cow x 10 Seated QL stretch with peanut stability ball 3 x 10 sec bilateral  Manual: STM to bilateral upper traps and cervical suboccipitals for improved tissue mobility and decreased pain. Manual assisted technique with Addaday to lumbar paraspinals for improved tissue mobility & decreased pain. Ice to left lower back x 10 min     08/07/23 NuStep L3 x 5' (PT present to discuss status) Piriformis stretch (seated) 3 x 30 sec Hook lying lower trunk rotation x 20 Hook lying combo hip IR and ER stretch x 20 Iron cross stretch with green loop 2 x 20sec bilateral  Patient had mentioned some patellofemoral discomfort, added quad sets, SAQ and SLR x 20 each Educated on how to control PF pain/alignment and importance of hip strength Hook lying clam x 20 with red loop  Side lying clam 2 x 10 red loop Side lying reverse clam 2 x 10 Quadruped TA activation 2 x 10 Quadruped slow rock x 5 each direction  (fwd/back) Seated QL stretch with peanut stability ball 3 x 10  sec bilateral  Seated clamshell with black loop x 20 (black loop next time) Hamstring stretch at mat table with balance poles 2 x 30 sec bilateral  Ice to left lower back x 10 min  08/02/23 NuStep L3 x 5' (PT present to discuss status) Piriformis stretch (seated) 3 x 30 sec Hamstring stretch at stair 2 x 30 sec bilateral  Seated QL stretch with peanut stability ball 3 x 10 sec bilateral  Quadruped TA activation 2 x 10 Quadruped slow rock x 5 each direction Side lying clam 2 x 10 no resistance Side lying reverse clam 2 x 10 Seated clamshell with black loop x 20 (black loop next time) Pallof Press with long red loop 2 x 15 each direction Ice to left lower back x 10 min    PATIENT EDUCATION:  Education details: Initiated HEP Person educated: Patient Education method: Programmer, multimedia, Facilities manager, Verbal cues, and Handouts Education comprehension: verbalized understanding, returned demonstration, and verbal cues required  HOME EXERCISE PROGRAM: Access Code: [facility] URL: [email or web address] Date: 06/29/2023 Prepared by: Orvil Beuhring  Exercises - Standing Hamstring Stretch on Chair  - 1 x daily - 7 x weekly - 1 sets - 3 reps - 30 sec hold - Quadricep Stretch with Chair and Counter Support  - 1 x daily - 7 x weekly - 1 sets - 3 reps - 30 sec hold - Seated Figure 4 Piriformis Stretch  - 1 x daily - 7 x weekly - 1 sets - 3 reps - 30 sed hold - Half Kneel Dynamic Hamstrings and Contralateral Hip Flexor Stretch  - 1 x daily - 7 x weekly - 1 sets - 3 reps - 15 hold - Half Kneeling Diagonal Chops with Medicine Ball  - Opposite Forward Leg  - 1 x daily - 7 x weekly - 1 sets - 8 reps - Supine Posterior Pelvic Tilt  - 1 x daily - 7 x weekly - 1 sets - 10 reps - Supine 90/90 Alternating Heel Touches with Posterior Pelvic Tilt  - 1 x daily - 7 x weekly - 1 sets - 10 reps  Birth Prep Exercises  :[id number] ASSESSMENT:  CLINICAL IMPRESSION: Aliene presents to therapy with no increased pain or discomfort. She verbalized relief after manual techniques last session. Today's treatment session focused on introducing birth prep exercises. Provided verbal and visual cues for correct performance and added exercises to HEP. Overall, patient tolerated treatment session well. Patient will benefit from skilled PT to address the below impairments and improve overall function.   OBJECTIVE IMPAIRMENTS: difficulty walking, decreased ROM, decreased strength, increased fascial restrictions, increased muscle spasms, impaired flexibility, postural dysfunction, and pain.   ACTIVITY LIMITATIONS: carrying, lifting, bending, standing, squatting, sleeping, stairs, transfers, bed mobility, bathing, dressing, and caring for others  PARTICIPATION LIMITATIONS: meal prep, cleaning, laundry, driving, shopping, community activity, occupation, and yard work  PERSONAL FACTORS: Fitness, Past/current experiences, and 1 comorbidity: pregnancy are also affecting patient's functional outcome.   REHAB POTENTIAL: Excellent  CLINICAL DECISION MAKING: Stable/uncomplicated  EVALUATION COMPLEXITY: Low   GOALS: Goals reviewed with patient? Yes  SHORT TERM GOALS: Target date: 07/20/2023  Pain report to be no greater than 4/10  Baseline: Goal status: MET 07/19/23  2.  Patient will be independent with initial HEP  Baseline:  Goal status: MET 07/19/23   LONG TERM GOALS: Target date: 09/28/2023   Patient to report pain no greater than 2/10  Baseline:  Goal status: In progress 08/16/2023  2.  Patient to be independent with  advanced HEP  Baseline:  Goal status: In progress 08/16/2023  3.  ODI to be 24% or less Baseline:  Goal status: MET 07/19/23  4.  Patient to be able to stand or walk for at least 15 min without hip pain  Baseline:  Goal status: In progress 08/16/2023  5.  Patient to be able to bend, stoop and squat with  pain no greater than 2/10  Baseline:  Goal status: In progress 08/16/2023  6.  Functional tests to improve by 2-3 seconds Baseline:  Goal status: MET 07/19/23  PLAN:  PT FREQUENCY: 1-2x/week  PT DURATION: 8 weeks  PLANNED INTERVENTIONS: 97110-Therapeutic exercises, 97530- Therapeutic activity, 97112- Neuromuscular re-education, 97535- Self Care, 02859- Manual therapy, [phone]- Gait training, [phone]- Aquatic Therapy, Patient/Family education, Stair training, Taping, Dry Needling, Cryotherapy, and Moist heat.  PLAN FOR NEXT SESSION: assess birth prep exercises:  Continue core strengthening, TA and mobility training.  Ice after sessions.    Kristeen Sar, PT 08/23/23 11:17 AM [facility] [address], [address] [place], [place] [address] Phone # [phone] Fax [phone]

## 2023-08-29 ENCOUNTER — Ambulatory Visit: Payer: Self-pay | Admitting: Physical Therapy

## 2023-08-29 ENCOUNTER — Encounter: Payer: Self-pay | Admitting: Physical Therapy

## 2023-08-29 DIAGNOSIS — M6281 Muscle weakness (generalized): Secondary | ICD-10-CM

## 2023-08-29 DIAGNOSIS — M5459 Other low back pain: Secondary | ICD-10-CM | POA: Diagnosis not present

## 2023-08-29 DIAGNOSIS — M25552 Pain in left hip: Secondary | ICD-10-CM

## 2023-08-29 DIAGNOSIS — R252 Cramp and spasm: Secondary | ICD-10-CM

## 2023-08-29 DIAGNOSIS — R262 Difficulty in walking, not elsewhere classified: Secondary | ICD-10-CM

## 2023-08-29 DIAGNOSIS — R293 Abnormal posture: Secondary | ICD-10-CM

## 2023-08-29 NOTE — Therapy (Addendum)
 OUTPATIENT PHYSICAL THERAPY THORACOLUMBAR TREATMENT  Patient Name: Norma Deleon MRN: [id number] DOB:09-17-2000, 23 y.o., female Today's Date: 08/29/2023  END OF SESSION:  PT End of Session - 08/29/23 0928     Visit Number 11    Number of Visits 27    Date for PT Re-Evaluation 09/28/23    Authorization Type Medicaid  [place] [employer] Approved 6 visits-07/02/2023-7/02/20/2023-auth#[id number]    Authorization Time Period [employer] approved 6 visits 08/03/2023 - 10/01/2023 auth#[id number]    Authorization - Visit Number 5    Authorization - Number of Visits 6    Progress Note Due on Visit 10    PT Start Time 0928    PT Stop Time 1007    PT Time Calculation (min) 39 min    Activity Tolerance Patient tolerated treatment well    Behavior During Therapy [facility] for tasks assessed/performed                 History reviewed. No pertinent past medical history. Past Surgical History:  Procedure Laterality Date   NO PAST SURGERIES     Patient Active Problem List   Diagnosis Date Noted   Elevated BP without diagnosis of hypertension 08/12/2023   Group B streptococcal bacteriuria 06/12/2023   Supervision of normal pregnancy 03/05/2023   Overweight BMI=28.7 04/19/2021    PCP: PCP - General   REFERRING PROVIDER: Herchel Gloris LABOR, MD  REFERRING DIAG: O99.891,M54.9 (ICD-10-CM) - Back pain in pregnancy O26.899,R10.2 (ICD-10-CM) - Pelvic pain in pregnancy  Rationale for Evaluation and Treatment: Rehabilitation  THERAPY DIAG:  Other low back pain  Pain in left hip  Muscle weakness (generalized)  Difficulty in walking, not elsewhere classified  Cramp and spasm  Abnormal posture  ONSET DATE: 06/12/2023  SUBJECTIVE:                                                                                                                                                                                           SUBJECTIVE STATEMENT: Just tired. One more month  PERTINENT HISTORY:   na  PAIN:  08/23/23 Are you having pain? Yes: NPRS scale: 0/10  Pain location: low back , hips and pelvic floor Pain description: aching Aggravating factors: sleeping, standing for prolonged periods of time Relieving factors: stretching, walking and typically a hot bath but has not been able to do this due to pregnancy  PRECAUTIONS: Other: pregnancy  RED FLAGS: None   WEIGHT BEARING RESTRICTIONS: No  FALLS:  Has patient fallen in last 6 months? No  LIVING ENVIRONMENT: Lives with: lives with their family Lives in: House/apartment   OCCUPATION: ask next visit  PLOF:  Independent, Independent with basic ADLs, Independent with household mobility without device, Independent with community mobility without device, Independent with gait, and Independent with transfers  PATIENT GOALS: to get some relief of the pain so that I can continue a normal pregnancy and not be on bed rest  NEXT MD VISIT: prn  OBJECTIVE:  Note: Objective measures were completed at Evaluation unless otherwise noted.  DIAGNOSTIC FINDINGS:  na  PATIENT SURVEYS:  Initial eval: Modified Oswestry 17/50=34%   07/19/23: Modified Oswestry 12/50=24%    COGNITION: Overall cognitive status: Within functional limits for tasks assessed     SENSATION: WFL  MUSCLE LENGTH: Hamstrings: Right 55 deg; Left 50 deg Thomas test: Right neg Left neg  POSTURE: increased lumbar lordosis  PALPATION: Symmetry throughout, slightly tender   LUMBAR ROM:   All WNL  LOWER EXTREMITY ROM:     Hip IR and ER limited bilaterally.  All others WNL  LOWER EXTREMITY MMT:    Generally 4 to 4+/5 bilaterally  LUMBAR SPECIAL TESTS:  Straight leg raise test: Negative  FUNCTIONAL TESTS:   Initial eval: 5 times sit to stand: 11.01 sec Timed up and go (TUG): 8.46 sec  07/19/23: 5 times sit to stand: 8.93 sec Timed up and go (TUG): 5.47 sec  GAIT: Distance walked: 50 feet Assistive device utilized: None Level of  assistance: Complete Independence Comments: guarded antalgic  TREATMENT DATE:  08/29/23 Quadriped to Child Pose x 20 sec Quadruped slow rock x 10 each direction  [employer] 2 x 30 sec bilateral; did one set in modified pigeon off EOB Open Books x 10 bilateral  Sidelying clamshell x 12 bilateral  Sidelying reverse clamshell x 12 bilateral  Supine hip internal rotation stretch  x 30 sec Supine hip internal/ external rotation x 10 each side Deep Squat with pelvic floor relaxation 2 x 30sec Seated on stability ball: pelvic tilts, circles x 20 each direction Seated clamshell with black loop x 20 Seated row GTB x 1 and BTB, then with band under her feet x 10 GTB and BTB   08/23/23 NuStep L5 x 5' (PT present to discuss status) Child Pose 2 x 30 Quadruped slow rock x 10 each direction  Pigeon Pose 2 x 30 sec bilateral  Deep Squat with pelvic floor relaxation 2 x 30sec Open Books x 10 bilateral  Sidelying clamshell x 12 bilateral  Sidelying reverse clamshell x 12 bilateral  Supine hip internal/ external rotation x 10 each sideSupine hip internal/ external rotation x 10 each side Supine hip internal rotation stretch  x 30 sec L very tight today Seated on stability ball: pelvic tilts, circles x 20 each direction Seated clamshell with black loop x 20 Ice to left lower back x 10 min  08/16/23 NuStep L5 x 5' (PT present to discuss status) Seated 3 way stability ball stretch x 5 each direction Hook lying lower trunk rotation x 20 total Hook lying combo hip IR and ER stretch x 20 Quadruped slow rock x 5 each direction (fwd/back) Childs Pose 3 x 20 sec Child's Pose + sidebend x  20 each each direction  Cat Cow x 10 Seated QL stretch with peanut stability ball 3 x 10 sec bilateral  Manual: STM to bilateral upper traps and cervical suboccipitals for improved tissue mobility and decreased pain. Manual assisted technique with Addaday to lumbar paraspinals for improved tissue mobility & decreased  pain. Ice to left lower back x 10 min     08/07/23 NuStep L3 x 5' (  PT present to discuss status) Piriformis stretch (seated) 3 x 30 sec Hook lying lower trunk rotation x 20 Hook lying combo hip IR and ER stretch x 20 Iron cross stretch with green loop 2 x 20sec bilateral  Patient had mentioned some patellofemoral discomfort, added quad sets, SAQ and SLR x 20 each Educated on how to control PF pain/alignment and importance of hip strength Hook lying clam x 20 with red loop  Side lying clam 2 x 10 red loop Side lying reverse clam 2 x 10 Quadruped TA activation 2 x 10 Quadruped slow rock x 5 each direction (fwd/back) Seated QL stretch with peanut stability ball 3 x 10 sec bilateral  Seated clamshell with black loop x 20 (black loop next time) Hamstring stretch at mat table with balance poles 2 x 30 sec bilateral  Ice to left lower back x 10 min  08/02/23 NuStep L3 x 5' (PT present to discuss status) Piriformis stretch (seated) 3 x 30 sec Hamstring stretch at stair 2 x 30 sec bilateral  Seated QL stretch with peanut stability ball 3 x 10 sec bilateral  Quadruped TA activation 2 x 10 Quadruped slow rock x 5 each direction Side lying clam 2 x 10 no resistance Side lying reverse clam 2 x 10 Seated clamshell with black loop x 20 (black loop next time) Pallof Press with long red loop 2 x 15 each direction Ice to left lower back x 10 min    PATIENT EDUCATION:  Education details: Initiated HEP Person educated: Patient Education method: Programmer, multimedia, Facilities manager, Verbal cues, and Handouts Education comprehension: verbalized understanding, returned demonstration, and verbal cues required  HOME EXERCISE PROGRAM: Access Code: [facility] URL: [email or web address] Date: 06/29/2023 Prepared by: Orvil Beuhring  Exercises - Standing Hamstring Stretch on Chair  - 1 x daily - 7 x weekly - 1 sets - 3 reps - 30 sec hold - Quadricep Stretch with Chair and Counter Support  -  1 x daily - 7 x weekly - 1 sets - 3 reps - 30 sec hold - Seated Figure 4 Piriformis Stretch  - 1 x daily - 7 x weekly - 1 sets - 3 reps - 30 sed hold - Half Kneel Dynamic Hamstrings and Contralateral Hip Flexor Stretch  - 1 x daily - 7 x weekly - 1 sets - 3 reps - 15 hold - Half Kneeling Diagonal Chops with Medicine Ball  - Opposite Forward Leg  - 1 x daily - 7 x weekly - 1 sets - 8 reps - Supine Posterior Pelvic Tilt  - 1 x daily - 7 x weekly - 1 sets - 10 reps - Supine 90/90 Alternating Heel Touches with Posterior Pelvic Tilt  - 1 x daily - 7 x weekly - 1 sets - 10 reps  Birth Prep Exercises :[id number] ASSESSMENT:  CLINICAL IMPRESSION: Patient continues to progress with her LTGs. She has met her walking and squat goals and denies pain with these activities. She still gets some pain with standing in B hips R>L. She also reports feelings of weakness in her B LE. This happens more than the pain now. . Bending and stooping are limited due to the growth of the baby. She is sleeping better but must sleep in a reclined position. Patient easily winded today with some exercises. She continues to demonstrate potential for improvement and would benefit from continued skilled therapy to address impairments.     OBJECTIVE IMPAIRMENTS: difficulty walking, decreased ROM, decreased strength, increased fascial restrictions, increased muscle  spasms, impaired flexibility, postural dysfunction, and pain.   ACTIVITY LIMITATIONS: carrying, lifting, bending, standing, squatting, sleeping, stairs, transfers, bed mobility, bathing, dressing, and caring for others  PARTICIPATION LIMITATIONS: meal prep, cleaning, laundry, driving, shopping, community activity, occupation, and yard work  PERSONAL FACTORS: Fitness, Past/current experiences, and 1 comorbidity: pregnancy are also affecting patient's functional outcome.   REHAB POTENTIAL: Excellent  CLINICAL DECISION MAKING: Stable/uncomplicated  EVALUATION COMPLEXITY:  Low   GOALS: Goals reviewed with patient? Yes  SHORT TERM GOALS: Target date: 07/20/2023  Pain report to be no greater than 4/10  Baseline: Goal status: MET 07/19/23  2.  Patient will be independent with initial HEP  Baseline:  Goal status: MET 07/19/23   LONG TERM GOALS: Target date: 09/28/2023   Patient to be able to stand for at least 20 min with pain no greater than 2/10 to be able to cook or do dishes and do routine grooming and ADL's Baseline:  Goal status: In progress 08/16/2023  2.  Patient to be independent with advanced HEP  Baseline:  Goal status: In progress 08/16/2023  3.  ODI to be 24% or less Baseline:  Goal status: MET 07/19/23  4.  Patient to be able to stand or walk for at least 15 min without hip pain  Baseline:  Goal status: In progress 08/29/2023 Standing 10 min, MET for walking  5.  Patient to be able to bend, stoop and squat with pain no greater than 2/10 to be able to pick up objects from the floor, do sit to stand, be able to bathe her lower body, don socks and shoes and put pants and undergarments on.  Baseline:  Goal status: In progress 08/29/2023 MET for squatting  6.  Functional tests to improve by 2-3 seconds Baseline:  Goal status: MET 07/19/23  PLAN:  PT FREQUENCY: 1-2x/week  PT DURATION: 8 weeks  PLANNED INTERVENTIONS: 97110-Therapeutic exercises, 97530- Therapeutic activity, 97112- Neuromuscular re-education, 97535- Self Care, 02859- Manual therapy, [phone]- Gait training, [phone]- Aquatic Therapy, Patient/Family education, Stair training, Taping, Dry Needling, Cryotherapy, and Moist heat.  PLAN FOR NEXT SESSION: Continue core strengthening, TA and mobility training.  Ice after sessions.    Addended for insurance review by Delon NOVAK. Fields, PT 09/06/23 12:14 PM  Mliss Cummins, PT  08/29/23 10:11 AM [facility] [address], [address] [place], [place] [address] Phone # [phone] Fax [phone]

## 2023-09-05 ENCOUNTER — Ambulatory Visit: Admitting: Obstetrics and Gynecology

## 2023-09-05 ENCOUNTER — Other Ambulatory Visit (HOSPITAL_COMMUNITY)
Admission: RE | Admit: 2023-09-05 | Discharge: 2023-09-05 | Disposition: A | Discharge status: Home / Self Care | Source: Ambulatory Visit | Attending: Obstetrics and Gynecology | Admitting: Obstetrics and Gynecology

## 2023-09-05 VITALS — BP 124/73 | HR 96 | Wt 203.0 lb

## 2023-09-05 DIAGNOSIS — Z1331 Encounter for screening for depression: Secondary | ICD-10-CM

## 2023-09-05 DIAGNOSIS — R030 Elevated blood-pressure reading, without diagnosis of hypertension: Secondary | ICD-10-CM | POA: Diagnosis not present

## 2023-09-05 DIAGNOSIS — Z3A36 36 weeks gestation of pregnancy: Secondary | ICD-10-CM

## 2023-09-05 DIAGNOSIS — Z3403 Encounter for supervision of normal first pregnancy, third trimester: Secondary | ICD-10-CM | POA: Diagnosis not present

## 2023-09-05 DIAGNOSIS — Z3493 Encounter for supervision of normal pregnancy, unspecified, third trimester: Secondary | ICD-10-CM

## 2023-09-05 NOTE — Patient Instructions (Signed)
 Signs and Symptoms of Labor Labor is the body's natural process of moving the baby and the placenta out of the uterus. The process of labor usually starts when the baby is full-term, between 74 and 41 weeks of pregnancy. Signs and symptoms that you are close to going into labor As your body prepares for labor and the birth of your baby, you may notice the following symptoms in the weeks and days before true labor starts: Passing a small amount of thick, bloody mucus from your vagina. This is called normal bloody show or losing your mucus plug. This may happen more than a week before labor begins, or right before labor begins, as the opening of the cervix starts to widen (dilate). For some women, the entire mucus plug passes at once. For others, pieces of the mucus plug may gradually pass over several days. Your baby moving (dropping) lower in your pelvis to get into position for birth (lightening). When this happens, you may feel more pressure on your bladder and pelvic bone and less pressure on your ribs. This may make it easier to breathe. It may also cause you to need to urinate more often and have problems with bowel movements. Having "practice contractions," also called Braxton Hicks contractions or false labor. These occur at irregular (unevenly spaced) intervals that are more than 10 minutes apart. False labor contractions are common after exercise or sexual activity. They will stop if you change position, rest, or drink fluids. These contractions are usually mild and do not get stronger over time. They may feel like: A backache or back pain. Mild cramps, similar to menstrual cramps. Tightening or pressure in your abdomen. Other early symptoms include: Nausea or loss of appetite. Diarrhea. Having a sudden burst of energy, or feeling very tired. Mood changes. Having trouble sleeping. Signs and symptoms that labor has begun Signs that you are in labor may include: Having contractions that come  at regular (evenly spaced) intervals and increase in intensity. This may feel like more intense tightening or pressure in your abdomen that moves to your back. Contractions may also feel like rhythmic pain in your upper thighs or back that comes and goes at regular intervals. If you are delivering for the first time, this change in intensity of contractions often occurs at a more gradual pace. If you have given birth before, you may notice a more rapid progression of contraction changes. Feeling pressure in the vaginal area. Your water breaking (rupture of membranes). This is when the sac of fluid that surrounds your baby breaks. Fluid leaking from your vagina may be clear or blood-tinged. Labor usually starts within 24 hours of your water breaking, but it may take longer to begin. Some people may feel a sudden gush of fluid; others may notice repeatedly damp underwear. Follow these instructions at home:  When labor starts, or if your water breaks, call your health care provider or nurse care line. Based on your situation, they will determine when you should go in for an exam. During early labor, you may be able to rest and manage symptoms at home. Some strategies to try at home include: Breathing and relaxation techniques. Taking a warm bath or shower. Listening to music. Using a heating pad on the lower back for pain. If directed, apply heat to the area as often as told by your health care provider. Use the heat source that your health care provider recommends, such as a moist heat pack or a heating pad. Place a  towel between your skin and the heat source. Leave the heat on for 20-30 minutes. Remove the heat if your skin turns bright red. This is especially important if you are unable to feel pain, heat, or cold. You have a greater risk of getting burned. Contact a health care provider if: Your labor has started. Your water breaks. You have nausea, vomiting, or diarrhea. Get help right away  if: You have painful, regular contractions that are 5 minutes apart or less. Labor starts before you are [redacted] weeks along in your pregnancy. You have a fever. You have bright red blood coming from your vagina. You do not feel your baby moving. You have a severe headache with or without vision problems. You have chest pain or shortness of breath. These symptoms may represent a serious problem that is an emergency. Do not wait to see if the symptoms will go away. Get medical help right away. Call your local emergency services (911 in the [place] Do not drive yourself to the hospital. Summary Labor is your body's natural process of moving your baby and the placenta out of your uterus. The process of labor usually starts when your baby is full-term, between 25 and 40 weeks of pregnancy. When labor starts, or if your water breaks, call your health care provider or nurse care line. Based on your situation, they will determine when you should go in for an exam. This information is not intended to replace advice given to you by your health care provider. Make sure you discuss any questions you have with your health care provider. Document Revised: 06/15/2020 Document Reviewed: 06/15/2020 Elsevier Patient Education  2024 [employer].

## 2023-09-05 NOTE — Progress Notes (Signed)
   LOW-RISK PREGNANCY OFFICE VISIT Patient name: Norma Deleon MRN [id number]  Date of birth: 2000-09-29 Chief Complaint:   Routine Prenatal Visit  History of Present Illness:   Norma Deleon is a 23 y.o. G8P0000 female at [redacted]w[redacted]d with an Estimated Date of Delivery: 10/02/23 being seen today for ongoing management of a low-risk pregnancy.  Today she reports occasional contractions. Contractions: Irritability. Vag. Bleeding: None.  Movement: Present. denies leaking of fluid. Review of Systems:   Pertinent items are noted in HPI Denies abnormal vaginal discharge w/ itching/odor/irritation, headaches, visual changes, shortness of breath, chest pain, abdominal pain, severe nausea/vomiting, or problems with urination or bowel movements unless otherwise stated above. Pertinent History Reviewed:  Reviewed past medical,surgical, social, obstetrical and family history.  Reviewed problem list, medications and allergies. Physical Assessment:   Vitals:   09/05/23 1115  BP: 124/73  Pulse: 96  Weight: 203 lb (92.1 kg)  Body mass index is 33.78 kg/m.        Physical Examination:   General appearance: Well appearing, and in no distress  Mental status: Alert, oriented to person, place, and time  Skin: Warm & dry  Cardiovascular: Normal heart rate noted  Respiratory: Normal respiratory effort, no distress  Abdomen: Soft, gravid, nontender  Pelvic: Cervical exam performed  Dilation: Closed Effacement (%): 50 Station: Ballotable  Extremities: Edema: Trace  Fetal Status: Fetal Heart Rate (bpm): 142 Fundal Height: 39 cm Movement: Present Presentation: Vertex  No results found for this or any previous visit (from the past 24 hours).  Assessment & Plan:  1) Low-risk pregnancy G1P0000 at [redacted]w[redacted]d with an Estimated Date of Delivery: 10/02/23   2) Encounter for supervision of low-risk pregnancy in third trimester (Primary) - Cervicovaginal ancillary only - Strep Gp B NAA - Information provided on signs of  labor   3) Elevated BP without diagnosis of hypertension [R03.0] - BP WNL  4) [redacted] weeks gestation of pregnancy     Meds: No orders of the defined types were placed in this encounter.  Labs/procedures today: GBS, GC/CT and cervical exam  Plan:  Continue routine obstetrical care   Reviewed: Preterm labor symptoms and general obstetric precautions including but not limited to vaginal bleeding, contractions, leaking of fluid and fetal movement were reviewed in detail with the patient.  All questions were answered. Has home bp cuff. Check bp weekly, let us  know if >140/90.   Follow-up: Return in about 1 week (around 09/12/2023) for Return OB visit.  Orders Placed This Encounter  Procedures   Strep Gp B NAA   Ala Cart MSN, CNM 09/05/2023 11:27 AM

## 2023-09-05 NOTE — Progress Notes (Signed)
 ROB  GBS today.  Cervix check.  CC: None

## 2023-09-06 LAB — CERVICOVAGINAL ANCILLARY ONLY
Chlamydia: NEGATIVE
Comment: NEGATIVE
Comment: NORMAL
Neisseria Gonorrhea: NEGATIVE

## 2023-09-07 ENCOUNTER — Ambulatory Visit: Payer: Self-pay | Admitting: Obstetrics and Gynecology

## 2023-09-07 ENCOUNTER — Ambulatory Visit

## 2023-09-07 DIAGNOSIS — R293 Abnormal posture: Secondary | ICD-10-CM

## 2023-09-07 DIAGNOSIS — M6281 Muscle weakness (generalized): Secondary | ICD-10-CM

## 2023-09-07 DIAGNOSIS — M5459 Other low back pain: Secondary | ICD-10-CM | POA: Diagnosis not present

## 2023-09-07 DIAGNOSIS — R252 Cramp and spasm: Secondary | ICD-10-CM

## 2023-09-07 DIAGNOSIS — R262 Difficulty in walking, not elsewhere classified: Secondary | ICD-10-CM

## 2023-09-07 DIAGNOSIS — M25552 Pain in left hip: Secondary | ICD-10-CM

## 2023-09-07 LAB — STREP GP B NAA: Strep Gp B NAA: NEGATIVE

## 2023-09-07 NOTE — Therapy (Signed)
 OUTPATIENT PHYSICAL THERAPY THORACOLUMBAR TREATMENT  Patient Name: Norma Deleon MRN: [id number] DOB:Jul 09, 2000, 23 y.o., female Today's Date: 09/07/2023  END OF SESSION:  PT End of Session - 09/07/23 0850     Visit Number 12    Number of Visits 27    Authorization Type Medicaid  [employer] [employer] Approved 6 visits-07/02/2023-7/02/20/2023-auth#[id number]    Authorization Time Period [employer] approved 6 visits 08/03/2023 - 10/01/2023 auth#[id number]    PT Start Time 0843    PT Stop Time 0931    PT Time Calculation (min) 48 min    Activity Tolerance Patient tolerated treatment well    Behavior During Therapy [facility] for tasks assessed/performed                 History reviewed. No pertinent past medical history. Past Surgical History:  Procedure Laterality Date   NO PAST SURGERIES     Patient Active Problem List   Diagnosis Date Noted   Elevated BP without diagnosis of hypertension 08/12/2023   Group B streptococcal bacteriuria 06/12/2023   Supervision of normal pregnancy 03/05/2023   Overweight BMI=28.7 04/19/2021    PCP: PCP - General   REFERRING PROVIDER: Herchel Gloris LABOR, MD  REFERRING DIAG: O99.891,M54.9 (ICD-10-CM) - Back pain in pregnancy O26.899,R10.2 (ICD-10-CM) - Pelvic pain in pregnancy  Rationale for Evaluation and Treatment: Rehabilitation  THERAPY DIAG:  Other low back pain  Pain in left hip  Muscle weakness (generalized)  Difficulty in walking, not elsewhere classified  Cramp and spasm  Abnormal posture  ONSET DATE: 06/12/2023  SUBJECTIVE:                                                                                                                                                                                           SUBJECTIVE STATEMENT: I was hurting when I woke up but once I got moving I started feeling better. No pain at beginning of session.    PERTINENT HISTORY:  na  PAIN:  09/07/23 Are you having pain? Yes: NPRS scale:  0/10  Pain location: low back , hips and pelvic floor Pain description: aching Aggravating factors: sleeping, standing for prolonged periods of time Relieving factors: stretching, walking and typically a hot bath but has not been able to do this due to pregnancy  PRECAUTIONS: Other: pregnancy  RED FLAGS: None   WEIGHT BEARING RESTRICTIONS: No  FALLS:  Has patient fallen in last 6 months? No  LIVING ENVIRONMENT: Lives with: lives with their family Lives in: House/apartment   OCCUPATION: ask next visit  PLOF: Independent, Independent with basic ADLs, Independent with household mobility without device, Independent with community mobility  without device, Independent with gait, and Independent with transfers  PATIENT GOALS: to get some relief of the pain so that I can continue a normal pregnancy and not be on bed rest  NEXT MD VISIT: prn  OBJECTIVE:  Note: Objective measures were completed at Evaluation unless otherwise noted.  DIAGNOSTIC FINDINGS:  na  PATIENT SURVEYS:  Initial eval: Modified Oswestry 17/50=34%   07/19/23: Modified Oswestry 12/50=24%    COGNITION: Overall cognitive status: Within functional limits for tasks assessed     SENSATION: WFL  MUSCLE LENGTH: Hamstrings: Right 55 deg; Left 50 deg Thomas test: Right neg Left neg  POSTURE: increased lumbar lordosis  PALPATION: Symmetry throughout, slightly tender   LUMBAR ROM:   All WNL  LOWER EXTREMITY ROM:     Hip IR and ER limited bilaterally.  All others WNL  LOWER EXTREMITY MMT:    Generally 4 to 4+/5 bilaterally  LUMBAR SPECIAL TESTS:  Straight leg raise test: Negative  FUNCTIONAL TESTS:   Initial eval: 5 times sit to stand: 11.01 sec Timed up and go (TUG): 8.46 sec  07/19/23: 5 times sit to stand: 8.93 sec Timed up and go (TUG): 5.47 sec  GAIT: Distance walked: 50 feet Assistive device utilized: None Level of assistance: Complete Independence Comments: guarded  antalgic  TREATMENT DATE:  09/07/23 Nustep x 5 min level 4 Seated clamshell x 20 with yellow loop Seated piriformis stretch 3 x 30 sec each  Seated hamstring stretch 3 x 30 sec each Hook lying trunk rotation x 20 Hook lying hip IR/ER combo stretch x 20 Side lying open book x 10 each side Elevated bridge with 3 sec pelvic floor contraction: feet on 16 side of rit fit box  Elevated bridge with increased hip height with feet on 16 side of rit fit box 2 x 10 Ice to lumbar spine x 10 min at end of session  08/29/23 Quadriped to Child Pose x 20 sec Quadruped slow rock x 10 each direction  [employer] 2 x 30 sec bilateral; did one set in modified pigeon off EOB Open Books x 10 bilateral  Sidelying clamshell x 12 bilateral  Sidelying reverse clamshell x 12 bilateral  Supine hip internal rotation stretch  x 30 sec Supine hip internal/ external rotation x 10 each side Deep Squat with pelvic floor relaxation 2 x 30sec Seated on stability ball: pelvic tilts, circles x 20 each direction Seated clamshell with black loop x 20 Seated row GTB x 1 and BTB, then with band under her feet x 10 GTB and BTB   08/23/23 NuStep L5 x 5' (PT present to discuss status) Child Pose 2 x 30 Quadruped slow rock x 10 each direction  Pigeon Pose 2 x 30 sec bilateral  Deep Squat with pelvic floor relaxation 2 x 30sec Open Books x 10 bilateral  Sidelying clamshell x 12 bilateral  Sidelying reverse clamshell x 12 bilateral  Supine hip internal/ external rotation x 10 each sideSupine hip internal/ external rotation x 10 each side Supine hip internal rotation stretch  x 30 sec L very tight today Seated on stability ball: pelvic tilts, circles x 20 each direction Seated clamshell with black loop x 20 Ice to left lower back x 10 min  PATIENT EDUCATION:  Education details: Initiated HEP Person educated: Patient Education method: Programmer, multimedia, Facilities manager, Verbal cues, and Handouts Education comprehension:  verbalized understanding, returned demonstration, and verbal cues required  HOME EXERCISE PROGRAM: Access Code: [facility] URL: [email or web address] Date: 06/29/2023 Prepared by: Orvil Fester  Exercises - Standing Hamstring Stretch on Chair  - 1 x daily - 7 x weekly - 1 sets - 3 reps - 30 sec hold - Quadricep Stretch with Chair and Counter Support  - 1 x daily - 7 x weekly - 1 sets - 3 reps - 30 sec hold - Seated Figure 4 Piriformis Stretch  - 1 x daily - 7 x weekly - 1 sets - 3 reps - 30 sed hold - Half Kneel Dynamic Hamstrings and Contralateral Hip Flexor Stretch  - 1 x daily - 7 x weekly - 1 sets - 3 reps - 15 hold - Half Kneeling Diagonal Chops with Medicine Ball  - Opposite Forward Leg  - 1 x daily - 7 x weekly - 1 sets - 8 reps - Supine Posterior Pelvic Tilt  - 1 x daily - 7 x weekly - 1 sets - 10 reps - Supine 90/90 Alternating Heel Touches with Posterior Pelvic Tilt  - 1 x daily - 7 x weekly - 1 sets - 10 reps  Birth Prep Exercises :[id number] ASSESSMENT:  CLINICAL IMPRESSION: Patient is progressing appropriately and meeting goals.  She was quite fatigued upon arrival.  She was able to complete above tasks with no increase in pain.  Discussed pain control and how to determine need for ice vs heat.  Also warned against over heating.   She continues to demonstrate potential for improvement and would benefit from continued skilled therapy to address impairments.     OBJECTIVE IMPAIRMENTS: difficulty walking, decreased ROM, decreased strength, increased fascial restrictions, increased muscle spasms, impaired flexibility, postural dysfunction, and pain.   ACTIVITY LIMITATIONS: carrying, lifting, bending, standing, squatting, sleeping, stairs, transfers, bed mobility, bathing, dressing, and caring for others  PARTICIPATION LIMITATIONS: meal prep, cleaning, laundry, driving, shopping, community activity, occupation, and yard work  PERSONAL FACTORS: Fitness, Past/current  experiences, and 1 comorbidity: pregnancy are also affecting patient's functional outcome.   REHAB POTENTIAL: Excellent  CLINICAL DECISION MAKING: Stable/uncomplicated  EVALUATION COMPLEXITY: Low   GOALS: Goals reviewed with patient? Yes  SHORT TERM GOALS: Target date: 07/20/2023  Pain report to be no greater than 4/10  Baseline: Goal status: MET 07/19/23  2.  Patient will be independent with initial HEP  Baseline:  Goal status: MET 07/19/23   LONG TERM GOALS: Target date: 09/28/2023   Patient to be able to stand for at least 20 min with pain no greater than 2/10 to be able to cook or do dishes and do routine grooming and ADL's Baseline:  Goal status: In progress 08/16/2023  2.  Patient to be independent with advanced HEP  Baseline:  Goal status: In progress 08/16/2023  3.  ODI to be 24% or less Baseline:  Goal status: MET 07/19/23  4.  Patient to be able to stand or walk for at least 15 min without hip pain  Baseline:  Goal status: In progress 08/29/2023 Standing 10 min, MET for walking  5.  Patient to be able to bend, stoop and squat with pain no greater than 2/10 to be able to pick up objects from the floor, do sit to stand, be able to bathe her lower body, don socks and shoes and put pants and undergarments on.  Baseline:  Goal status: In progress 08/29/2023 MET for squatting  6.  Functional tests to improve by 2-3 seconds Baseline:  Goal status: MET 07/19/23  PLAN:  PT FREQUENCY: 1-2x/week  PT DURATION: 8 weeks  PLANNED INTERVENTIONS: 97110-Therapeutic exercises, 97530- Therapeutic activity, 97112-  Neuromuscular re-education, [id number]- Self Care, 02859- Manual therapy, [id number]- Gait training, [phone]- Aquatic Therapy, Patient/Family education, Stair training, Taping, Dry Needling, Cryotherapy, and Moist heat.  PLAN FOR NEXT SESSION: Continue core strengthening, TA and mobility training.  Ice after sessions.    Delon B. Otisha Spickler, PT 09/07/23 9:26 AM [facility] [address], [address] [place], [place] [address] Phone # [phone] Fax [phone]

## 2023-09-12 ENCOUNTER — Ambulatory Visit (INDEPENDENT_AMBULATORY_CARE_PROVIDER_SITE_OTHER): Admitting: Certified Nurse Midwife

## 2023-09-12 VITALS — BP 130/80 | HR 84 | Wt 206.0 lb

## 2023-09-12 DIAGNOSIS — Z3A37 37 weeks gestation of pregnancy: Secondary | ICD-10-CM

## 2023-09-12 DIAGNOSIS — Z3403 Encounter for supervision of normal first pregnancy, third trimester: Secondary | ICD-10-CM | POA: Diagnosis not present

## 2023-09-12 DIAGNOSIS — N949 Unspecified condition associated with female genital organs and menstrual cycle: Secondary | ICD-10-CM | POA: Diagnosis not present

## 2023-09-12 NOTE — Progress Notes (Signed)
 ROB: Doing well, having some right sided round ligament pain

## 2023-09-12 NOTE — Progress Notes (Signed)
   PRENATAL VISIT NOTE  Subjective:  Norma Deleon is a 23 y.o. G1P0000 at [redacted]w[redacted]d being seen today for ongoing prenatal care.  She is currently monitored for the following issues for this low-risk pregnancy and has Overweight BMI=28.7; Supervision of normal pregnancy; Group B streptococcal bacteriuria; and Elevated BP without diagnosis of hypertension on their problem list.  Patient reports right sided hip pain with movement and some post coital spotting .  Contractions: Not present. Vag. Bleeding: None.  Movement: Present. Denies leaking of fluid.   The following portions of the patient's history were reviewed and updated as appropriate: allergies, current medications, past family history, past medical history, past social history, past surgical history and problem list.   Objective:    Vitals:   09/12/23 1119  BP: 130/80  Pulse: 84  Weight: 206 lb (93.4 kg)    Fetal Status:  Fetal Heart Rate (bpm): 152 Fundal Height: 37 cm Movement: Present    General: Alert, oriented and cooperative. Patient is in no acute distress.  Skin: Skin is warm and dry. No rash noted.   Cardiovascular: Normal heart rate noted  Respiratory: Normal respiratory effort, no problems with respiration noted  Abdomen: Soft, gravid, appropriate for gestational age.  Pain/Pressure: Present     Pelvic: Cervical exam deferred        Extremities: Normal range of motion.  Edema: Trace  Mental Status: Normal mood and affect. Normal behavior. Normal judgment and thought content.   Assessment and Plan:  Pregnancy: G1P0000 at [redacted]w[redacted]d 1. Encounter for supervision of low-risk first pregnancy in third trimester (Primary) - Patient doing well  - Reports vigorous and frequent fetal movement.   2. [redacted] weeks gestation of pregnancy - Vertex via leopolds  - Reviewed labor readiness with patient including the [employer], evening primrose oil, and raspberry leaf tea.  - Fundal height appropriate for gestational age   87. Round  ligament pain - Reviewed Round ligament pain as a normal discomfort of pregnancy.  - Recommended the use of a maternity support belt, stretching and warm baths. Also encouraged to continue with physical therapy.   Term labor symptoms and general obstetric precautions including but not limited to vaginal bleeding, contractions, leaking of fluid and fetal movement were reviewed in detail with the patient. Please refer to After Visit Summary for other counseling recommendations.   Return in about 1 week (around 09/19/2023) for LOB.  Future Appointments  Date Time Provider Department Center  09/14/2023 11:00 AM Antonetta Glade BROCKS, PT [facility] None  09/19/2023 10:15 AM Harvey Delon NOVAK, PT [facility] None  09/19/2023  3:50 PM Izell Harari, MD [facility]  09/26/2023  9:30 AM Harvey Delon NOVAK, PT [facility] None  09/26/2023  2:50 PM Emilio Delilah HERO, CNM [facility]    Tierra Divelbiss Erven) Emilio, MSN, CNM  Center for [facility]  09/12/2023 11:39 AM

## 2023-09-12 NOTE — Patient Instructions (Signed)
 Things to Try After 37 weeks to Encourage Labor/Get Ready for Labor:    Try the [employer] at [email or web address] daily to improve baby's position and encourage the onset of labor.  Walk a little and rest a little every day.  Change positions often.  Cervical Ripening: May try one or both Red Raspberry Leaf capsules or tea:  two 300mg  or 400mg  tablets with each meal, 2-3 times a day, or 1-3 cups of tea daily  Potential Side Effects Of Raspberry Leaf:  Most women do not experience any side effects from drinking raspberry leaf tea. However, nausea and loose stools are possible   Evening Primrose Oil capsules: take 1 capsule by mouth and place one capsule in the vagina every night.    Some of the potential side effects:  Upset stomach  Loose stools or diarrhea  Headaches  Nausea  Sex can also help the cervix ripen and encourage labor onset.

## 2023-09-14 ENCOUNTER — Ambulatory Visit: Attending: Obstetrics & Gynecology | Admitting: Physical Therapy

## 2023-09-14 DIAGNOSIS — M5459 Other low back pain: Secondary | ICD-10-CM | POA: Diagnosis present

## 2023-09-14 DIAGNOSIS — R293 Abnormal posture: Secondary | ICD-10-CM | POA: Diagnosis present

## 2023-09-14 DIAGNOSIS — M25552 Pain in left hip: Secondary | ICD-10-CM | POA: Insufficient documentation

## 2023-09-14 DIAGNOSIS — R252 Cramp and spasm: Secondary | ICD-10-CM | POA: Diagnosis present

## 2023-09-14 DIAGNOSIS — R262 Difficulty in walking, not elsewhere classified: Secondary | ICD-10-CM | POA: Insufficient documentation

## 2023-09-14 DIAGNOSIS — M6281 Muscle weakness (generalized): Secondary | ICD-10-CM | POA: Insufficient documentation

## 2023-09-14 NOTE — Therapy (Signed)
 OUTPATIENT PHYSICAL THERAPY THORACOLUMBAR TREATMENT  Patient Name: Norma Deleon MRN: [id number] DOB:2000/02/15, 23 y.o., female Today's Date: 09/14/2023  END OF SESSION:  PT End of Session - 09/14/23 1105     Visit Number 13    Number of Visits 27    Date for PT Re-Evaluation 09/28/23    Authorization Type Medicaid  [place] [employer] Approved 4 visits 8/1-8/30    Authorization - Visit Number 1    Authorization - Number of Visits 4    PT Start Time 1102    PT Stop Time 1142    PT Time Calculation (min) 40 min    Activity Tolerance Patient tolerated treatment well                 No past medical history on file. Past Surgical History:  Procedure Laterality Date   NO PAST SURGERIES     Patient Active Problem List   Diagnosis Date Noted   Elevated BP without diagnosis of hypertension 08/12/2023   Group B streptococcal bacteriuria 06/12/2023   Supervision of normal pregnancy 03/05/2023   Overweight BMI=28.7 04/19/2021    PCP: PCP - General   REFERRING PROVIDER: Herchel Gloris LABOR, MD  REFERRING DIAG: O99.891,M54.9 (ICD-10-CM) - Back pain in pregnancy O26.899,R10.2 (ICD-10-CM) - Pelvic pain in pregnancy  Rationale for Evaluation and Treatment: Rehabilitation  THERAPY DIAG:  Other low back pain  Pain in left hip  Muscle weakness (generalized)  ONSET DATE: 06/12/2023  SUBJECTIVE:                                                                                                                                                                                           SUBJECTIVE STATEMENT: Discomfort lower abdomen/ groin area.  Planning a water birth.  Now 37 1/2 weeks. Here with her mom. PERTINENT HISTORY:  na  PAIN:  09/14/23 Are you having pain? Yes: NPRS scale: 6/10  Pain location: lower abdomen  Pain description: aching Aggravating factors: sleeping, standing for prolonged periods of time Relieving factors: stretching, walking and typically a hot  bath but has not been able to do this due to pregnancy  PRECAUTIONS: Other: pregnancy  RED FLAGS: None   WEIGHT BEARING RESTRICTIONS: No  FALLS:  Has patient fallen in last 6 months? No  LIVING ENVIRONMENT: Lives with: lives with their family Lives in: House/apartment   OCCUPATION: ask next visit  PLOF: Independent, Independent with basic ADLs, Independent with household mobility without device, Independent with community mobility without device, Independent with gait, and Independent with transfers  PATIENT GOALS: to get some relief of the pain so that I can  continue a normal pregnancy and not be on bed rest  NEXT MD VISIT: prn  OBJECTIVE:  Note: Objective measures were completed at Evaluation unless otherwise noted.  DIAGNOSTIC FINDINGS:  na  PATIENT SURVEYS:  Initial eval: Modified Oswestry 17/50=34%   07/19/23: Modified Oswestry 12/50=24%    COGNITION: Overall cognitive status: Within functional limits for tasks assessed     SENSATION: WFL  MUSCLE LENGTH: Hamstrings: Right 55 deg; Left 50 deg Thomas test: Right neg Left neg  POSTURE: increased lumbar lordosis  PALPATION: Symmetry throughout, slightly tender   LUMBAR ROM:   All WNL  LOWER EXTREMITY ROM:     Hip IR and ER limited bilaterally.  All others WNL  LOWER EXTREMITY MMT:    Generally 4 to 4+/5 bilaterally  LUMBAR SPECIAL TESTS:  Straight leg raise test: Negative  FUNCTIONAL TESTS:   Initial eval: 5 times sit to stand: 11.01 sec Timed up and go (TUG): 8.46 sec  07/19/23: 5 times sit to stand: 8.93 sec Timed up and go (TUG): 5.47 sec  GAIT: Distance walked: 50 feet Assistive device utilized: None Level of assistance: Complete Independence Comments: guarded antalgic  TREATMENT DATE:  09/14/23: Karolynn pose (wider knees) Discussion of other quadruped exs: cat/cow, tail wag  Discussion of other birth prep positions: deep squatting, hip internal/external rotation Sitting on blue  ball: hip adductor stretch, pelvic rocks, circles Sidelying with top leg on peanut ball with forward and back oscillations 10x right/left Manual therapy: soft tissue mobilization to lumbar musculature 8 min with pt seated leaning forward onto the back of another chair  09/07/23 Nustep x 5 min level 4 Seated clamshell x 20 with yellow loop Seated piriformis stretch 3 x 30 sec each  Seated hamstring stretch 3 x 30 sec each Hook lying trunk rotation x 20 Hook lying hip IR/ER combo stretch x 20 Side lying open book x 10 each side Elevated bridge with 3 sec pelvic floor contraction: feet on 16 side of rit fit box  Elevated bridge with increased hip height with feet on 16 side of rit fit box 2 x 10 Ice to lumbar spine x 10 min at end of session  08/29/23 Quadriped to Child Pose x 20 sec Quadruped slow rock x 10 each direction  [employer] 2 x 30 sec bilateral; did one set in modified pigeon off EOB Open Books x 10 bilateral  Sidelying clamshell x 12 bilateral  Sidelying reverse clamshell x 12 bilateral  Supine hip internal rotation stretch  x 30 sec Supine hip internal/ external rotation x 10 each side Deep Squat with pelvic floor relaxation 2 x 30sec Seated on stability ball: pelvic tilts, circles x 20 each direction Seated clamshell with black loop x 20 Seated row GTB x 1 and BTB, then with band under her feet x 10 GTB and BTB   PATIENT EDUCATION:  Education details: Initiated HEP Person educated: Patient Education method: Programmer, multimedia, Facilities manager, Verbal cues, and Handouts Education comprehension: verbalized understanding, returned demonstration, and verbal cues required  HOME EXERCISE PROGRAM: Access Code: [facility] URL: [email or web address] Date: 06/29/2023 Prepared by: Orvil Beuhring  Exercises - Standing Hamstring Stretch on Chair  - 1 x daily - 7 x weekly - 1 sets - 3 reps - 30 sec hold - Quadricep Stretch with Chair and Counter Support  - 1 x daily -  7 x weekly - 1 sets - 3 reps - 30 sec hold - Seated Figure 4 Piriformis Stretch  - 1 x daily - 7 x  weekly - 1 sets - 3 reps - 30 sed hold - Half Kneel Dynamic Hamstrings and Contralateral Hip Flexor Stretch  - 1 x daily - 7 x weekly - 1 sets - 3 reps - 15 hold - Half Kneeling Diagonal Chops with Medicine Ball  - Opposite Forward Leg  - 1 x daily - 7 x weekly - 1 sets - 8 reps - Supine Posterior Pelvic Tilt  - 1 x daily - 7 x weekly - 1 sets - 10 reps - Supine 90/90 Alternating Heel Touches with Posterior Pelvic Tilt  - 1 x daily - 7 x weekly - 1 sets - 10 reps  Birth Prep Exercises :[id number] ASSESSMENT:  CLINICAL IMPRESSION: As pt nears the end of her 3rd trimester, we reviewed birth prep exercises and positions of comfort and mobility.  She gets good relief from the childs pose position. She responds well to soft tissue mobilization of the lumbar musculature performed in the seated position.  Therapist monitoring response to all interventions and modifying treatment accordingly.     OBJECTIVE IMPAIRMENTS: difficulty walking, decreased ROM, decreased strength, increased fascial restrictions, increased muscle spasms, impaired flexibility, postural dysfunction, and pain.   ACTIVITY LIMITATIONS: carrying, lifting, bending, standing, squatting, sleeping, stairs, transfers, bed mobility, bathing, dressing, and caring for others  PARTICIPATION LIMITATIONS: meal prep, cleaning, laundry, driving, shopping, community activity, occupation, and yard work  PERSONAL FACTORS: Fitness, Past/current experiences, and 1 comorbidity: pregnancy are also affecting patient's functional outcome.   REHAB POTENTIAL: Excellent  CLINICAL DECISION MAKING: Stable/uncomplicated  EVALUATION COMPLEXITY: Low   GOALS: Goals reviewed with patient? Yes  SHORT TERM GOALS: Target date: 07/20/2023  Pain report to be no greater than 4/10  Baseline: Goal status: MET 07/19/23  2.  Patient will be independent with initial  HEP  Baseline:  Goal status: MET 07/19/23   LONG TERM GOALS: Target date: 09/28/2023   Patient to be able to stand for at least 20 min with pain no greater than 2/10 to be able to cook or do dishes and do routine grooming and ADL's Baseline:  Goal status: In progress 08/16/2023  2.  Patient to be independent with advanced HEP  Baseline:  Goal status: In progress 08/16/2023  3.  ODI to be 24% or less Baseline:  Goal status: MET 07/19/23  4.  Patient to be able to stand or walk for at least 15 min without hip pain  Baseline:  Goal status: In progress 08/29/2023 Standing 10 min, MET for walking  5.  Patient to be able to bend, stoop and squat with pain no greater than 2/10 to be able to pick up objects from the floor, do sit to stand, be able to bathe her lower body, don socks and shoes and put pants and undergarments on.  Baseline:  Goal status: In progress 08/29/2023 MET for squatting  6.  Functional tests to improve by 2-3 seconds Baseline:  Goal status: MET 07/19/23  PLAN:  PT FREQUENCY: 1-2x/week  PT DURATION: 8 weeks  PLANNED INTERVENTIONS: 97110-Therapeutic exercises, 97530- Therapeutic activity, 97112- Neuromuscular re-education, 97535- Self Care, 02859- Manual therapy, [phone]- Gait training, [phone]- Aquatic Therapy, Patient/Family education, Stair training, Taping, Dry Needling, Cryotherapy, and Moist heat.  PLAN FOR NEXT SESSION: soft tissue mobilization;  Continue core strengthening, TA and mobility training.  Ice after sessions.  Approaching due date mid Aug  Glade Pesa, PT 09/14/23 11:55 AM Phone: [phone] Fax: [phone] [facility] [address], [address] [place], [place] [address] Phone #  [phone] Fax [phone]

## 2023-09-19 ENCOUNTER — Ambulatory Visit (INDEPENDENT_AMBULATORY_CARE_PROVIDER_SITE_OTHER): Admitting: Obstetrics and Gynecology

## 2023-09-19 ENCOUNTER — Ambulatory Visit

## 2023-09-19 VITALS — BP 124/77 | HR 88 | Wt 208.0 lb

## 2023-09-19 DIAGNOSIS — R252 Cramp and spasm: Secondary | ICD-10-CM

## 2023-09-19 DIAGNOSIS — M6281 Muscle weakness (generalized): Secondary | ICD-10-CM

## 2023-09-19 DIAGNOSIS — M5459 Other low back pain: Secondary | ICD-10-CM

## 2023-09-19 DIAGNOSIS — R8271 Bacteriuria: Secondary | ICD-10-CM

## 2023-09-19 DIAGNOSIS — M25552 Pain in left hip: Secondary | ICD-10-CM

## 2023-09-19 DIAGNOSIS — R030 Elevated blood-pressure reading, without diagnosis of hypertension: Secondary | ICD-10-CM | POA: Diagnosis not present

## 2023-09-19 DIAGNOSIS — Z3A38 38 weeks gestation of pregnancy: Secondary | ICD-10-CM

## 2023-09-19 DIAGNOSIS — R262 Difficulty in walking, not elsewhere classified: Secondary | ICD-10-CM

## 2023-09-19 DIAGNOSIS — R293 Abnormal posture: Secondary | ICD-10-CM

## 2023-09-19 NOTE — Therapy (Signed)
 OUTPATIENT PHYSICAL THERAPY THORACOLUMBAR TREATMENT  Patient Name: Norma Deleon MRN: [id number] DOB:02-20-2000, 23 y.o., female Today's Date: 09/19/2023  END OF SESSION:  PT End of Session - 09/19/23 1023     Visit Number 14    Number of Visits 15    Date for PT Re-Evaluation 09/28/23    Authorization Type Medicaid  [place] [employer] [place] [employer] Approved 4 visits 8/1-8/30    Authorization Time Period [employer] approved 6 visits 08/03/2023 - 10/01/2023 auth#[id number]    Authorization - Visit Number 2    Authorization - Number of Visits 4    PT Start Time 1018    PT Stop Time 1058    PT Time Calculation (min) 40 min    Activity Tolerance Patient tolerated treatment well    Behavior During Therapy [facility] for tasks assessed/performed                 History reviewed. No pertinent past medical history. Past Surgical History:  Procedure Laterality Date   NO PAST SURGERIES     Patient Active Problem List   Diagnosis Date Noted   Elevated BP without diagnosis of hypertension 08/12/2023   Group B streptococcal bacteriuria 06/12/2023   Supervision of normal pregnancy 03/05/2023   Overweight BMI=28.7 04/19/2021    PCP: PCP - General   REFERRING PROVIDER: Herchel Gloris LABOR, MD  REFERRING DIAG: O99.891,M54.9 (ICD-10-CM) - Back pain in pregnancy O26.899,R10.2 (ICD-10-CM) - Pelvic pain in pregnancy  Rationale for Evaluation and Treatment: Rehabilitation  THERAPY DIAG:  Other low back pain  Pain in left hip  Muscle weakness (generalized)  Difficulty in walking, not elsewhere classified  Cramp and spasm  Abnormal posture  ONSET DATE: 06/12/2023  SUBJECTIVE:                                                                                                                                                                                           SUBJECTIVE STATEMENT: 2 weeks until due date.  Pain 3/10 and primarily lower abdominal.    PERTINENT HISTORY:  na  PAIN:   09/19/23 Are you having pain? Yes: NPRS scale: 3/10  Pain location: lower abdomen  Pain description: aching Aggravating factors: sleeping, standing for prolonged periods of time Relieving factors: stretching, walking and typically a hot bath but has not been able to do this due to pregnancy  PRECAUTIONS: Other: pregnancy  RED FLAGS: None   WEIGHT BEARING RESTRICTIONS: No  FALLS:  Has patient fallen in last 6 months? No  LIVING ENVIRONMENT: Lives with: lives with their family Lives in: House/apartment   OCCUPATION: ask next visit  PLOF: Independent, Independent with  basic ADLs, Independent with household mobility without device, Independent with community mobility without device, Independent with gait, and Independent with transfers  PATIENT GOALS: to get some relief of the pain so that I can continue a normal pregnancy and not be on bed rest  NEXT MD VISIT: prn  OBJECTIVE:  Note: Objective measures were completed at Evaluation unless otherwise noted.  DIAGNOSTIC FINDINGS:  na  PATIENT SURVEYS:  Initial eval: Modified Oswestry 17/50=34%   07/19/23: Modified Oswestry 12/50=24%    COGNITION: Overall cognitive status: Within functional limits for tasks assessed     SENSATION: WFL  MUSCLE LENGTH: Hamstrings: Right 55 deg; Left 50 deg Thomas test: Right neg Left neg  POSTURE: increased lumbar lordosis  PALPATION: Symmetry throughout, slightly tender   LUMBAR ROM:   All WNL  LOWER EXTREMITY ROM:     Hip IR and ER limited bilaterally.  All others WNL  LOWER EXTREMITY MMT:    Generally 4 to 4+/5 bilaterally  LUMBAR SPECIAL TESTS:  Straight leg raise test: Negative  FUNCTIONAL TESTS:   Initial eval: 5 times sit to stand: 11.01 sec Timed up and go (TUG): 8.46 sec  07/19/23: 5 times sit to stand: 8.93 sec Timed up and go (TUG): 5.47 sec  GAIT: Distance walked: 50 feet Assistive device utilized: None Level of assistance: Complete  Independence Comments: guarded antalgic  TREATMENT DATE:  09/19/23: Nustep x 5 min level 5 Seated piriformis stretch 3 x 30 sec each side Supine iron cross stretch with strap 3 x 10 sec each side Hook lying LTR x 20 Quadruped cat/cow x 10 Massage/STM to lumbar spine seated on blue physio ball leaning fwd on back of chair with pillow x 10 min  09/14/23: Karolynn pose (wider knees) Discussion of other quadruped exs: cat/cow, tail wag  Discussion of other birth prep positions: deep squatting, hip internal/external rotation Sitting on blue ball: hip adductor stretch, pelvic rocks, circles Sidelying with top leg on peanut ball with forward and back oscillations 10x right/left Manual therapy: soft tissue mobilization to lumbar musculature 8 min with pt seated leaning forward onto the back of another chair  09/07/23 Nustep x 5 min level 4 Seated clamshell x 20 with yellow loop Seated piriformis stretch 3 x 30 sec each  Seated hamstring stretch 3 x 30 sec each Hook lying trunk rotation x 20 Hook lying hip IR/ER combo stretch x 20 Side lying open book x 10 each side Elevated bridge with 3 sec pelvic floor contraction: feet on 16 side of rit fit box  Elevated bridge with increased hip height with feet on 16 side of rit fit box 2 x 10 Ice to lumbar spine x 10 min at end of session  PATIENT EDUCATION:  Education details: Initiated HEP Person educated: Patient Education method: Programmer, multimedia, Facilities manager, Verbal cues, and Handouts Education comprehension: verbalized understanding, returned demonstration, and verbal cues required  HOME EXERCISE PROGRAM: Access Code: [facility] URL: [email or web address] Date: 06/29/2023 Prepared by: Orvil Beuhring  Exercises - Standing Hamstring Stretch on Chair  - 1 x daily - 7 x weekly - 1 sets - 3 reps - 30 sec hold - Quadricep Stretch with Chair and Counter Support  - 1 x daily - 7 x weekly - 1 sets - 3 reps - 30 sec hold - Seated Figure  4 Piriformis Stretch  - 1 x daily - 7 x weekly - 1 sets - 3 reps - 30 sed hold - Half Kneel Dynamic Hamstrings and Contralateral Hip Flexor Stretch  -  1 x daily - 7 x weekly - 1 sets - 3 reps - 15 hold - Half Kneeling Diagonal Chops with Medicine Ball  - Opposite Forward Leg  - 1 x daily - 7 x weekly - 1 sets - 8 reps - Supine Posterior Pelvic Tilt  - 1 x daily - 7 x weekly - 1 sets - 10 reps - Supine 90/90 Alternating Heel Touches with Posterior Pelvic Tilt  - 1 x daily - 7 x weekly - 1 sets - 10 reps  Birth Prep Exercises :[id number] ASSESSMENT:  CLINICAL IMPRESSION: Patient is nearing due date and progressing well.  Her primary pain is lower abdominal but she continues to have some aching in the lumbar spine with post rest.  She is compliant with her HEP and should be ready for DC next visit.   OBJECTIVE IMPAIRMENTS: difficulty walking, decreased ROM, decreased strength, increased fascial restrictions, increased muscle spasms, impaired flexibility, postural dysfunction, and pain.   ACTIVITY LIMITATIONS: carrying, lifting, bending, standing, squatting, sleeping, stairs, transfers, bed mobility, bathing, dressing, and caring for others  PARTICIPATION LIMITATIONS: meal prep, cleaning, laundry, driving, shopping, community activity, occupation, and yard work  PERSONAL FACTORS: Fitness, Past/current experiences, and 1 comorbidity: pregnancy are also affecting patient's functional outcome.   REHAB POTENTIAL: Excellent  CLINICAL DECISION MAKING: Stable/uncomplicated  EVALUATION COMPLEXITY: Low   GOALS: Goals reviewed with patient? Yes  SHORT TERM GOALS: Target date: 07/20/2023  Pain report to be no greater than 4/10  Baseline: Goal status: MET 07/19/23  2.  Patient will be independent with initial HEP  Baseline:  Goal status: MET 07/19/23   LONG TERM GOALS: Target date: 09/28/2023   Patient to be able to stand for at least 20 min with pain no greater than 2/10 to be able to cook or do  dishes and do routine grooming and ADL's Baseline:  Goal status: In progress 08/16/2023  2.  Patient to be independent with advanced HEP  Baseline:  Goal status: In progress 08/16/2023  3.  ODI to be 24% or less Baseline:  Goal status: MET 07/19/23  4.  Patient to be able to stand or walk for at least 15 min without hip pain  Baseline:  Goal status: In progress 08/29/2023 Standing 10 min, MET for walking  5.  Patient to be able to bend, stoop and squat with pain no greater than 2/10 to be able to pick up objects from the floor, do sit to stand, be able to bathe her lower body, don socks and shoes and put pants and undergarments on.  Baseline:  Goal status: In progress 08/29/2023 MET for squatting  6.  Functional tests to improve by 2-3 seconds Baseline:  Goal status: MET 07/19/23  PLAN:  PT FREQUENCY: 1-2x/week  PT DURATION: 8 weeks  PLANNED INTERVENTIONS: 97110-Therapeutic exercises, 97530- Therapeutic activity, 97112- Neuromuscular re-education, 97535- Self Care, 02859- Manual therapy, [phone]- Gait training, [phone]- Aquatic Therapy, Patient/Family education, Stair training, Taping, Dry Needling, Cryotherapy, and Moist heat.  PLAN FOR NEXT SESSION: 1 more visit, soft tissue mobilization;  Continue core strengthening, TA and mobility training.  Ice after sessions.  Approaching due date mid Aug  Glade Pesa, PT 09/19/23 11:05 AM Phone: [phone] Fax: [phone] [facility] [address] [place], [place] [address] Phone # [phone] Fax [phone]

## 2023-09-19 NOTE — Progress Notes (Signed)
   PRENATAL VISIT NOTE  Subjective:  Norma Deleon is a 23 y.o. G1P0000 at [redacted]w[redacted]d being seen today for ongoing prenatal care.  She is currently monitored for the following issues for this low-risk pregnancy and has Overweight BMI=28.7; Supervision of normal pregnancy; Group B streptococcal bacteriuria; and Elevated BP without diagnosis of hypertension on their problem list.  Patient reports occasional contractions.  Contractions: Irregular. Vag. Bleeding: None.  Movement: Present. Denies leaking of fluid.   The following portions of the patient's history were reviewed and updated as appropriate: allergies, current medications, past family history, past medical history, past social history, past surgical history and problem list.   Objective:    Vitals:   09/19/23 1548  BP: 124/77  Pulse: 88  Weight: 208 lb (94.3 kg)    Fetal Status:  Fetal Heart Rate (bpm): 147-148 Fundal Height: 38 cm Movement: Present Presentation: Vertex  General: Alert, oriented and cooperative. Patient is in no acute distress.  Skin: Skin is warm and dry. No rash noted.   Cardiovascular: Normal heart rate noted  Respiratory: Normal respiratory effort, no problems with respiration noted  Abdomen: Soft, gravid, appropriate for gestational age.  Pain/Pressure: Present     Pelvic: Cervical exam declined        Extremities: Normal range of motion.  Edema: None  Mental Status: Normal mood and affect. Normal behavior. Normal judgment and thought content.   Assessment and Plan:  Pregnancy: G1P0000 at [redacted]w[redacted]d 1. [redacted] weeks gestation of pregnancy (Primary) Routine care. D/w her re: 41wks post dates IOL. Desires waterbirth  2. Group B streptococcal bacteriuria Tx in labor  3. Elevated BP without diagnosis of hypertension Normal today  Term labor symptoms and general obstetric precautions including but not limited to vaginal bleeding, contractions, leaking of fluid and fetal movement were reviewed in detail with the  patient. Please refer to After Visit Summary for other counseling recommendations.   No follow-ups on file.  Future Appointments  Date Time Provider Department Center  09/26/2023  9:30 AM Harvey Delon NOVAK, PT [facility] None  09/26/2023  2:50 PM Emilio Delilah HERO, CNM [facility]    Bebe Furry, MD

## 2023-09-19 NOTE — Progress Notes (Signed)
 ROB: Does note some right arm pain  Denies any numbness/tingling   Describes the pain as sleeping on it wrong

## 2023-09-26 ENCOUNTER — Other Ambulatory Visit: Payer: Self-pay | Admitting: Medical Genetics

## 2023-09-26 ENCOUNTER — Ambulatory Visit (INDEPENDENT_AMBULATORY_CARE_PROVIDER_SITE_OTHER): Admitting: Certified Nurse Midwife

## 2023-09-26 ENCOUNTER — Other Ambulatory Visit (HOSPITAL_COMMUNITY)
Admission: RE | Admit: 2023-09-26 | Discharge: 2023-09-26 | Disposition: A | Discharge status: Home / Self Care | Source: Ambulatory Visit | Attending: Certified Nurse Midwife | Admitting: Certified Nurse Midwife

## 2023-09-26 ENCOUNTER — Ambulatory Visit

## 2023-09-26 VITALS — BP 120/78 | HR 85 | Wt 210.0 lb

## 2023-09-26 DIAGNOSIS — R262 Difficulty in walking, not elsewhere classified: Secondary | ICD-10-CM

## 2023-09-26 DIAGNOSIS — Z3493 Encounter for supervision of normal pregnancy, unspecified, third trimester: Secondary | ICD-10-CM

## 2023-09-26 DIAGNOSIS — M25552 Pain in left hip: Secondary | ICD-10-CM

## 2023-09-26 DIAGNOSIS — N898 Other specified noninflammatory disorders of vagina: Secondary | ICD-10-CM | POA: Diagnosis present

## 2023-09-26 DIAGNOSIS — M5459 Other low back pain: Secondary | ICD-10-CM

## 2023-09-26 DIAGNOSIS — M6281 Muscle weakness (generalized): Secondary | ICD-10-CM

## 2023-09-26 DIAGNOSIS — R252 Cramp and spasm: Secondary | ICD-10-CM

## 2023-09-26 DIAGNOSIS — R293 Abnormal posture: Secondary | ICD-10-CM

## 2023-09-26 DIAGNOSIS — Z3A39 39 weeks gestation of pregnancy: Secondary | ICD-10-CM | POA: Diagnosis not present

## 2023-09-26 NOTE — Progress Notes (Signed)
   PRENATAL VISIT NOTE  Subjective:  Norma Deleon is a 23 y.o. G1P0000 at [redacted]w[redacted]d being seen today for ongoing prenatal care.  She is currently monitored for the following issues for this low-risk pregnancy and has Overweight BMI=28.7; Supervision of normal pregnancy; Group B streptococcal bacteriuria; and Elevated BP without diagnosis of hypertension on their problem list.  Patient reports vaginal odor .  Contractions: Irritability. Vag. Bleeding: None.  Movement: Present. Denies leaking of fluid.   The following portions of the patient's history were reviewed and updated as appropriate: allergies, current medications, past family history, past medical history, past social history, past surgical history and problem list.   Objective:    Vitals:   09/26/23 1532  BP: 120/78  Pulse: 85  Weight: 210 lb (95.3 kg)    Fetal Status:  Fetal Heart Rate (bpm): 142 Fundal Height: 39 cm Movement: Present    General: Alert, oriented and cooperative. Patient is in no acute distress.  Skin: Skin is warm and dry. No rash noted.   Cardiovascular: Normal heart rate noted  Respiratory: Normal respiratory effort, no problems with respiration noted  Abdomen: Soft, gravid, appropriate for gestational age.  Pain/Pressure: Present     Pelvic: Cervical exam performed in the presence of a chaperone Dilation: Fingertip Effacement (%): Thick Station: -Montgomery Butter, CMA   Extremities: Normal range of motion.  Edema: None  Mental Status: Normal mood and affect. Normal behavior. Normal judgment and thought content.   Assessment and Plan:  Pregnancy: G1P0000 at [redacted]w[redacted]d 1. Encounter for supervision of low-risk pregnancy in third trimester (Primary) - Patient overall doing well. Reports tired of being pregnant   2. [redacted] weeks gestation of pregnancy - Fundal height appropriate for gestational age.  - Attempted a Membrane sweep but unsuccessful. 0.5/thick/-2. If still pregnant at next visit plan to repeat membrane  sweep.  - Reviewed labor readiness with patient including the [employer], evening primrose oil, and raspberry leaf tea.  - IOL scheduled for 41 weeks   3. Vaginal odor - Cervicovaginal ancillary only( [place])  Term labor symptoms and general obstetric precautions including but not limited to vaginal bleeding, contractions, leaking of fluid and fetal movement were reviewed in detail with the patient. Please refer to After Visit Summary for other counseling recommendations.   Return in about 1 week (around 10/03/2023) for  with Shay, LOB.  Future Appointments  Date Time Provider Department Center  10/03/2023  1:30 PM Emilio Delilah CHRISTELLA EDDY [facility]  10/10/2023  7:15 AM [facility] SCHED ROOM [facility] None    Jamarria Real Erven) Emilio, MSN, CNM  [facility]  09/26/2023 4:40 PM

## 2023-09-26 NOTE — Therapy (Signed)
 OUTPATIENT PHYSICAL THERAPY THORACOLUMBAR TREATMENT PHYSICAL THERAPY DISCHARGE SUMMARY  Visits from Start of Care: 15  Current functional level related to goals / functional outcomes: See below   Remaining deficits: See below   Education / Equipment: See below   Patient agrees to discharge. Patient goals were met. Patient is being discharged due to meeting the stated rehab goals.   Patient Name: Norma Deleon MRN: [id number] DOB:2000-12-01, 23 y.o., female Today's Date: 09/26/2023  END OF SESSION:  PT End of Session - 09/26/23 0933     Visit Number 15    Number of Visits 15    Date for PT Re-Evaluation 09/28/23    Authorization Type Medicaid  [facility] [place] [facility] Approved 4 visits 8/1-8/30    Authorization Time Period [employer] approved 6 visits 08/03/2023 - 10/01/2023 auth#[id number]    Authorization - Visit Number 4    Authorization - Number of Visits 4    Progress Note Due on Visit 10    PT Start Time 0935    PT Stop Time 1008 (P)     PT Time Calculation (min) 33 min (P)     Activity Tolerance Patient tolerated treatment well    Behavior During Therapy [facility] for tasks assessed/performed                 History reviewed. No pertinent past medical history. Past Surgical History:  Procedure Laterality Date   NO PAST SURGERIES     Patient Active Problem List   Diagnosis Date Noted   Elevated BP without diagnosis of hypertension 08/12/2023   Group B streptococcal bacteriuria 06/12/2023   Supervision of normal pregnancy 03/05/2023   Overweight BMI=28.7 04/19/2021    PCP: PCP - General   REFERRING PROVIDER: Herchel Gloris LABOR, MD  REFERRING DIAG: O99.891,M54.9 (ICD-10-CM) - Back pain in pregnancy O26.899,R10.2 (ICD-10-CM) - Pelvic pain in pregnancy  Rationale for Evaluation and Treatment: Rehabilitation  THERAPY DIAG:  Other low back pain  Pain in left hip  Muscle weakness (generalized)  Difficulty in walking, not elsewhere classified  Cramp and  spasm  Abnormal posture  ONSET DATE: 06/12/2023  SUBJECTIVE:                                                                                                                                                                                           SUBJECTIVE STATEMENT: 1 weeks until due date.  Pain 0/10 today.  Not really hurting, just feeling pressure and tight.   PERTINENT HISTORY:  na  PAIN:  09/26/23 Are you having pain? Yes: NPRS scale: 0/10  Pain location: lower abdomen  Pain description: aching Aggravating  factors: sleeping, standing for prolonged periods of time Relieving factors: stretching, walking and typically a hot bath but has not been able to do this due to pregnancy  PRECAUTIONS: Other: pregnancy  RED FLAGS: None   WEIGHT BEARING RESTRICTIONS: No  FALLS:  Has patient fallen in last 6 months? No  LIVING ENVIRONMENT: Lives with: lives with their family Lives in: House/apartment   OCCUPATION: ask next visit  PLOF: Independent, Independent with basic ADLs, Independent with household mobility without device, Independent with community mobility without device, Independent with gait, and Independent with transfers  PATIENT GOALS: to get some relief of the pain so that I can continue a normal pregnancy and not be on bed rest  NEXT MD VISIT: prn  OBJECTIVE:  Note: Objective measures were completed at Evaluation unless otherwise noted.  DIAGNOSTIC FINDINGS:  na  PATIENT SURVEYS:  Initial eval: Modified Oswestry 17/50=34%   07/19/23: Modified Oswestry 12/50=24%   09/26/23:  Modified Oswestry 9/50=18%  COGNITION: Overall cognitive status: Within functional limits for tasks assessed     SENSATION: WFL  MUSCLE LENGTH: Hamstrings: Right 55 deg; Left 50 deg  Thomas test: Right neg Left neg  POSTURE: increased lumbar lordosis  PALPATION: Symmetry throughout, slightly tender   LUMBAR ROM:   All WNL  LOWER EXTREMITY ROM:     Hip IR and ER  limited bilaterally.  All others WNL  LOWER EXTREMITY MMT:    Generally 4 to 4+/5 bilaterally  LUMBAR SPECIAL TESTS:  Straight leg raise test: Negative  FUNCTIONAL TESTS:   Initial eval: 5 times sit to stand: 11.01 sec Timed up and go (TUG): 8.46 sec  07/19/23: 5 times sit to stand: 8.93 sec Timed up and go (TUG): 5.47 sec  09/26/23: 5 times sit to stand: 7.72 sec Timed up and go (TUG): 6.39 sec  GAIT: Distance walked: 50 feet Assistive device utilized: None Level of assistance: Complete Independence Comments: guarded antalgic  TREATMENT DATE:  09/26/23: Nustep x 5 min level 5 Re- assessment completed Seated piriformis stretch 3 x 30 sec each side Quadruped cat/cow x 10 Supine iron cross stretch with strap 3 x 10 sec each side Hook lying LTR x 20 Massage/STM to lumbar spine seated on blue physio ball leaning fwd on back of chair with pillow x 10 min  09/19/23: Nustep x 5 min level 5 Seated piriformis stretch 3 x 30 sec each side Supine iron cross stretch with strap 3 x 10 sec each side Hook lying LTR x 20 Quadruped cat/cow x 10 Massage/STM to lumbar spine seated on blue physio ball leaning fwd on back of chair with pillow x 10 min  09/14/23: Karolynn pose (wider knees) Discussion of other quadruped exs: cat/cow, tail wag  Discussion of other birth prep positions: deep squatting, hip internal/external rotation Sitting on blue ball: hip adductor stretch, pelvic rocks, circles Sidelying with top leg on peanut ball with forward and back oscillations 10x right/left Manual therapy: soft tissue mobilization to lumbar musculature 8 min with pt seated leaning forward onto the back of another chair   PATIENT EDUCATION:  Education details: Initiated HEP Person educated: Patient Education method: Programmer, multimedia, Facilities manager, Verbal cues, and Handouts Education comprehension: verbalized understanding, returned demonstration, and verbal cues required  HOME EXERCISE  PROGRAM: Access Code: [facility] URL: [email or web address] Date: 06/29/2023 Prepared by: Orvil Beuhring  Exercises - Standing Hamstring Stretch on Chair  - 1 x daily - 7 x weekly - 1 sets - 3 reps - 30 sec hold - Quadricep Stretch  with Chair and Counter Support  - 1 x daily - 7 x weekly - 1 sets - 3 reps - 30 sec hold - Seated Figure 4 Piriformis Stretch  - 1 x daily - 7 x weekly - 1 sets - 3 reps - 30 sed hold - Half Kneel Dynamic Hamstrings and Contralateral Hip Flexor Stretch  - 1 x daily - 7 x weekly - 1 sets - 3 reps - 15 hold - Half Kneeling Diagonal Chops with Medicine Ball  - Opposite Forward Leg  - 1 x daily - 7 x weekly - 1 sets - 8 reps - Supine Posterior Pelvic Tilt  - 1 x daily - 7 x weekly - 1 sets - 10 reps - Supine 90/90 Alternating Heel Touches with Posterior Pelvic Tilt  - 1 x daily - 7 x weekly - 1 sets - 10 reps  Birth Prep Exercises :[id number] ASSESSMENT:  CLINICAL IMPRESSION: Patient is 1 week out from due date and progressing well.  This is her last visit.  She has met all goals and is managing her pain well.  She is compliant with her HEP and should continue to do well.  We will DC at this time as she is at end of approved visits and due date is next week.    OBJECTIVE IMPAIRMENTS: difficulty walking, decreased ROM, decreased strength, increased fascial restrictions, increased muscle spasms, impaired flexibility, postural dysfunction, and pain.   ACTIVITY LIMITATIONS: carrying, lifting, bending, standing, squatting, sleeping, stairs, transfers, bed mobility, bathing, dressing, and caring for others  PARTICIPATION LIMITATIONS: meal prep, cleaning, laundry, driving, shopping, community activity, occupation, and yard work  PERSONAL FACTORS: Fitness, Past/current experiences, and 1 comorbidity: pregnancy are also affecting patient's functional outcome.   REHAB POTENTIAL: Excellent  CLINICAL DECISION MAKING: Stable/uncomplicated  EVALUATION  COMPLEXITY: Low   GOALS: Goals reviewed with patient? Yes  SHORT TERM GOALS: Target date: 07/20/2023  Pain report to be no greater than 4/10  Baseline: Goal status: MET 07/19/23  2.  Patient will be independent with initial HEP  Baseline:  Goal status: MET 07/19/23   LONG TERM GOALS: Target date: 09/28/2023   Patient to be able to stand for at least 20 min with pain no greater than 2/10 to be able to cook or do dishes and do routine grooming and ADL's Baseline:  Goal status: MET 09/26/23  2.  Patient to be independent with advanced HEP  Baseline:  Goal status: MET 09/26/23  3.  ODI to be 24% or less Baseline:  Goal status: MET 07/19/23  4.  Patient to be able to stand or walk for at least 15 min without hip pain  Baseline:  Goal status: In progress 08/29/2023 Standing 10 min, MET for walking  5.  Patient to be able to bend, stoop and squat with pain no greater than 2/10 to be able to pick up objects from the floor, do sit to stand, be able to bathe her lower body, don socks and shoes and put pants and undergarments on.  Baseline:  Goal status: In progress 08/29/2023 MET for squatting  6.  Functional tests to improve by 2-3 seconds Baseline:  Goal status: MET 07/19/23  PLAN:  PT FREQUENCY: 1-2x/week  PT DURATION: 8 weeks  PLANNED INTERVENTIONS: 97110-Therapeutic exercises, 97530- Therapeutic activity, 97112- Neuromuscular re-education, 97535- Self Care, 02859- Manual therapy, [phone]- Gait training, [phone]- Aquatic Therapy, Patient/Family education, Stair training, Taping, Dry Needling, Cryotherapy, and Moist heat.  PLAN FOR NEXT SESSION: DC  Kiyonna Tortorelli B.  Lisandro Meggett, PT 09/26/23 10:19 AM [facility] [address], [address] [place], [place] [address] Phone # [phone] Fax [phone]

## 2023-09-26 NOTE — Progress Notes (Signed)
 ROB   Cervix check, membrane sweep  CC: on/off vaginal odor pt wants STD screening on swab along w/ BV and yeast.

## 2023-09-28 ENCOUNTER — Other Ambulatory Visit

## 2023-09-28 ENCOUNTER — Inpatient Hospital Stay (HOSPITAL_COMMUNITY)
Admission: AD | Admit: 2023-09-28 | Discharge: 2023-10-03 | DRG: 806 | Disposition: A | Discharge status: Home / Self Care | Attending: Obstetrics and Gynecology | Admitting: Obstetrics and Gynecology

## 2023-09-28 ENCOUNTER — Encounter (HOSPITAL_COMMUNITY): Payer: Self-pay | Admitting: Obstetrics and Gynecology

## 2023-09-28 DIAGNOSIS — Z87891 Personal history of nicotine dependence: Secondary | ICD-10-CM

## 2023-09-28 DIAGNOSIS — R030 Elevated blood-pressure reading, without diagnosis of hypertension: Secondary | ICD-10-CM

## 2023-09-28 DIAGNOSIS — Z833 Family history of diabetes mellitus: Secondary | ICD-10-CM

## 2023-09-28 DIAGNOSIS — R8271 Bacteriuria: Secondary | ICD-10-CM | POA: Diagnosis present

## 2023-09-28 DIAGNOSIS — Z3A39 39 weeks gestation of pregnancy: Secondary | ICD-10-CM

## 2023-09-28 DIAGNOSIS — R200 Anesthesia of skin: Secondary | ICD-10-CM | POA: Diagnosis not present

## 2023-09-28 DIAGNOSIS — O9081 Anemia of the puerperium: Secondary | ICD-10-CM | POA: Diagnosis not present

## 2023-09-28 DIAGNOSIS — O1203 Gestational edema, third trimester: Secondary | ICD-10-CM

## 2023-09-28 DIAGNOSIS — Z8249 Family history of ischemic heart disease and other diseases of the circulatory system: Secondary | ICD-10-CM

## 2023-09-28 DIAGNOSIS — D62 Acute posthemorrhagic anemia: Secondary | ICD-10-CM | POA: Diagnosis not present

## 2023-09-28 DIAGNOSIS — O139 Gestational [pregnancy-induced] hypertension without significant proteinuria, unspecified trimester: Secondary | ICD-10-CM | POA: Diagnosis present

## 2023-09-28 DIAGNOSIS — O134 Gestational [pregnancy-induced] hypertension without significant proteinuria, complicating childbirth: Principal | ICD-10-CM | POA: Diagnosis present

## 2023-09-28 DIAGNOSIS — O133 Gestational [pregnancy-induced] hypertension without significant proteinuria, third trimester: Principal | ICD-10-CM

## 2023-09-28 LAB — CBC
HCT: 34.8 % — ABNORMAL LOW (ref 36.0–46.0)
Hemoglobin: 11.4 g/dL — ABNORMAL LOW (ref 12.0–15.0)
MCH: 28.4 pg (ref 26.0–34.0)
MCHC: 32.8 g/dL (ref 30.0–36.0)
MCV: 86.8 fL (ref 80.0–100.0)
Platelets: 208 K/uL (ref 150–400)
RBC: 4.01 MIL/uL (ref 3.87–5.11)
RDW: 13.8 % (ref 11.5–15.5)
WBC: 11.9 K/uL — ABNORMAL HIGH (ref 4.0–10.5)
nRBC: 0 % (ref 0.0–0.2)

## 2023-09-28 LAB — CERVICOVAGINAL ANCILLARY ONLY
Bacterial Vaginitis (gardnerella): NEGATIVE
Candida Glabrata: NEGATIVE
Candida Vaginitis: NEGATIVE
Chlamydia: NEGATIVE
Comment: NEGATIVE
Comment: NEGATIVE
Comment: NEGATIVE
Comment: NEGATIVE
Comment: NEGATIVE
Comment: NORMAL
Neisseria Gonorrhea: NEGATIVE
Trichomonas: NEGATIVE

## 2023-09-28 LAB — COMPREHENSIVE METABOLIC PANEL WITH GFR
ALT: 18 U/L (ref 0–44)
AST: 25 U/L (ref 15–41)
Albumin: 2.6 g/dL — ABNORMAL LOW (ref 3.5–5.0)
Alkaline Phosphatase: 99 U/L (ref 38–126)
Anion gap: 10 (ref 5–15)
BUN: 7 mg/dL (ref 6–20)
CO2: 18 mmol/L — ABNORMAL LOW (ref 22–32)
Calcium: 8.9 mg/dL (ref 8.9–10.3)
Chloride: 108 mmol/L (ref 98–111)
Creatinine, Ser: 0.54 mg/dL (ref 0.44–1.00)
GFR, Estimated: >60 mL/min (ref >60)
Glucose, Bld: 105 mg/dL — ABNORMAL HIGH (ref 70–99)
Potassium: 3.6 mmol/L (ref 3.5–5.1)
Sodium: 136 mmol/L (ref 135–145)
Total Bilirubin: 0.5 mg/dL (ref 0.0–1.2)
Total Protein: 6.1 g/dL — ABNORMAL LOW (ref 6.5–8.1)

## 2023-09-28 LAB — PROTEIN / CREATININE RATIO, URINE
Creatinine, Urine: 44 mg/dL
Protein Creatinine Ratio: 0.18 mg/mg{creat} — ABNORMAL HIGH (ref 0.00–0.15)
Total Protein, Urine: 8 mg/dL

## 2023-09-28 NOTE — MAU Note (Signed)
 Pt says she feels UC's sometimes- none today.  Says her hands and feet are swollen  - more than usual. [facility] - [place]  No BP meds  Denies H/A, No vision changes, no epigastric

## 2023-09-28 NOTE — MAU Provider Note (Signed)
 Chief Complaint:  hands swollen   HPI    Norma Deleon is a 23 y.o. G1P0000 at [redacted]w[redacted]d who presents to maternity admissions reporting upper and lower extremity swelling.  Patient denies any headaches, visual changes, right upper quadrant pain, shortness of breath, chest pain.  She denies any vaginal bleeding, leaking fluid,  contractions are irregular and reports good fetal movement.   Upon chart review patient had elevated blood pressure on 08/12/2023 153/81 and 09/28/23 at 148/75 ruling her in for gestational hypertension at 39 weeks 4 days.  Pregnancy Course: [facility]  History reviewed. No pertinent past medical history. OB History  Gravida Para Term Preterm AB Living  1 0 0 0 0 0  SAB IAB Ectopic Multiple Live Births  0 0 0 0 0    # Outcome Date GA Lbr Len/2nd Weight Sex Type Anes PTL Lv  1 Current            Past Surgical History:  Procedure Laterality Date   NO PAST SURGERIES     Family History  Problem Relation Age of Onset   Diabetes Mother    Heart disease Mother    Healthy Father    Healthy Maternal Grandmother    Heart failure Maternal Grandfather    Diabetes Maternal Grandfather    Kidney disease Maternal Grandfather    Healthy Paternal Grandmother    Healthy Paternal Grandfather    Social History   Tobacco Use   Smoking status: Former    Types: E-cigarettes, Gaffer exposure: Past   Smokeless tobacco: Never  Vaping Use   Vaping status: Never Used  Substance Use Topics   Alcohol use: Not Currently    Alcohol/week: 5.0 standard drinks of alcohol    Types: 5 Shots of liquor per week    Comment: occassionally   Drug use: Not Currently    Types: Marijuana    Comment: January 2025   No Known Allergies Medications Prior to Admission  Medication Sig Dispense Refill Last Dose/Taking   Prenatal 28-0.8 MG TABS Take 1 tablet by mouth daily. 30 tablet 12 09/28/2023   acetaminophen  (TYLENOL ) 500 MG tablet Take 2 tablets (1,000 mg total) by mouth  every 8 (eight) hours as needed for mild pain (pain score 1-3). (Patient not taking: Reported on 09/19/2023) 100 tablet 1    cyclobenzaprine  (FLEXERIL ) 10 MG tablet Take 1 tablet (10 mg total) by mouth 3 (three) times daily as needed for muscle spasms. 30 tablet 2    polyethylene glycol powder (GLYCOLAX /MIRALAX ) 17 GM/SCOOP powder Take 17 g by mouth daily as needed for moderate constipation. (Patient not taking: Reported on 09/19/2023) 255 g 0    Prenatal Vit-Fe Fumarate-FA (PRENATAL VITAMINS) 27-0.8 MG TABS Take 1 capsule by mouth daily.       I have reviewed patient's Past Medical Hx, Surgical Hx, Family Hx, Social Hx, medications and allergies.   ROS  Pertinent items noted in HPI and remainder of comprehensive ROS otherwise negative.   PHYSICAL EXAM  Patient Vitals for the past 24 hrs:  BP Temp Temp src Pulse Resp SpO2 Height Weight  09/29/23 0200 131/72 -- -- 91 -- 99 % -- --  09/29/23 0057 (!) 141/81 98.9 F (37.2 C) Oral 85 18 100 % -- --  09/28/23 2330 128/70 -- -- 83 -- 99 % -- --  09/28/23 2325 -- -- -- -- -- 99 % -- --  09/28/23 2320 -- -- -- -- -- 99 % -- --  09/28/23 2315 129/77 -- -- 82 -- 100 % -- --  09/28/23 2300 136/74 -- -- 91 -- 100 % -- --  09/28/23 2255 -- -- -- -- -- 99 % -- --  09/28/23 2250 -- -- -- -- -- 99 % -- --  09/28/23 2245 133/71 -- -- 86 -- 98 % -- --  09/28/23 2230 125/71 -- -- 81 -- 99 % -- --  09/28/23 2225 -- -- -- -- -- 99 % -- --  09/28/23 2220 -- -- -- -- -- 99 % -- --  09/28/23 2215 127/70 -- -- 87 -- 100 % -- --  09/28/23 2207 (!) 148/75 -- -- 75 -- 99 % -- --  09/28/23 2148 133/77 98.5 F (36.9 C) Oral 93 14 -- 5' 4 (1.626 m) 96 kg    Constitutional: Well-developed, well-nourished female in no acute distress.  Cardiovascular: normal rate & rhythm, warm and well-perfused Respiratory: normal effort, no problems with respiration noted GI: Abd soft, non-tender, gravid, no ruq pain illicited MS: Extremities nontender, 2+ pedal edema, normal  ROM Neurologic: Alert and oriented x 4.  GU: no CVA tenderness Pelvic: Deferred     Fetal Tracing: @ 2240 Cat 1 reactive Baseline:140 Variability:moderate Accelerations: present Decelerations:absent Toco: irregular   Labs: Results for orders placed or performed during the hospital encounter of 09/28/23 (from the past 24 hours)  Protein / creatinine ratio, urine     Status: Abnormal   Collection Time: 09/28/23  9:26 PM  Result Value Ref Range   Creatinine, Urine 44 mg/dL   Total Protein, Urine 8 mg/dL   Protein Creatinine Ratio 0.18 (H) 0.00 - 0.15 mg/mg[Cre]  CBC     Status: Abnormal   Collection Time: 09/28/23 10:07 PM  Result Value Ref Range   WBC 11.9 (H) 4.0 - 10.5 K/uL   RBC 4.01 3.87 - 5.11 MIL/uL   Hemoglobin 11.4 (L) 12.0 - 15.0 g/dL   HCT 65.1 (L) 63.9 - 53.9 %   MCV 86.8 80.0 - 100.0 fL   MCH 28.4 26.0 - 34.0 pg   MCHC 32.8 30.0 - 36.0 g/dL   RDW 86.1 88.4 - 84.4 %   Platelets 208 150 - 400 K/uL   nRBC 0.0 0.0 - 0.2 %  Comprehensive metabolic panel     Status: Abnormal   Collection Time: 09/28/23 10:07 PM  Result Value Ref Range   Sodium 136 135 - 145 mmol/L   Potassium 3.6 3.5 - 5.1 mmol/L   Chloride 108 98 - 111 mmol/L   CO2 18 (L) 22 - 32 mmol/L   Glucose, Bld 105 (H) 70 - 99 mg/dL   BUN 7 6 - 20 mg/dL   Creatinine, Ser 9.45 0.44 - 1.00 mg/dL   Calcium 8.9 8.9 - 89.6 mg/dL   Total Protein 6.1 (L) 6.5 - 8.1 g/dL   Albumin 2.6 (L) 3.5 - 5.0 g/dL   AST 25 15 - 41 U/L   ALT 18 0 - 44 U/L   Alkaline Phosphatase 99 38 - 126 U/L   Total Bilirubin 0.5 0.0 - 1.2 mg/dL   GFR, Estimated >39 >39 mL/min   Anion gap 10 5 - 15  Type and screen [facility]     Status: None   Collection Time: 09/29/23 12:34 AM  Result Value Ref Range   ABO/RH(D) O POS    Antibody Screen NEG    Sample Expiration      10/02/2023,2359 Performed at [facility], [address] [place], [place] [address]       MDM & MAU COURSE  MDM:  HIGH- > Admit to  LD for IOL secondary to gHTN   MAU Course: Orders Placed This Encounter  Procedures   CBC   Comprehensive metabolic panel   Protein / creatinine ratio, urine   RPR   Measure blood pressure   Type and screen [facility]   Insert and maintain IV Line    ASSESSMENT   1. Gestational hypertension, third trimester   2. Gestational edema in third trimester   3. Group B streptococcal bacteriuria     PLAN     Admit to labor and delivery for IOL Routine lab orders placed Admission labs to be placed by L&D team GBS prophylaxis ---------------------------------------------------------------------------------------- Olam Dalton, MSN, [facility], [facility] for [facility]

## 2023-09-29 ENCOUNTER — Encounter (HOSPITAL_COMMUNITY): Payer: Self-pay | Admitting: Obstetrics and Gynecology

## 2023-09-29 ENCOUNTER — Other Ambulatory Visit: Payer: Self-pay

## 2023-09-29 DIAGNOSIS — Z8249 Family history of ischemic heart disease and other diseases of the circulatory system: Secondary | ICD-10-CM | POA: Diagnosis not present

## 2023-09-29 DIAGNOSIS — O99820 Streptococcus B carrier state complicating pregnancy: Secondary | ICD-10-CM | POA: Diagnosis not present

## 2023-09-29 DIAGNOSIS — O9081 Anemia of the puerperium: Secondary | ICD-10-CM | POA: Diagnosis not present

## 2023-09-29 DIAGNOSIS — O139 Gestational [pregnancy-induced] hypertension without significant proteinuria, unspecified trimester: Secondary | ICD-10-CM | POA: Diagnosis present

## 2023-09-29 DIAGNOSIS — D62 Acute posthemorrhagic anemia: Secondary | ICD-10-CM | POA: Diagnosis not present

## 2023-09-29 DIAGNOSIS — R030 Elevated blood-pressure reading, without diagnosis of hypertension: Secondary | ICD-10-CM | POA: Diagnosis not present

## 2023-09-29 DIAGNOSIS — Z3A39 39 weeks gestation of pregnancy: Secondary | ICD-10-CM | POA: Diagnosis not present

## 2023-09-29 DIAGNOSIS — Z833 Family history of diabetes mellitus: Secondary | ICD-10-CM | POA: Diagnosis not present

## 2023-09-29 DIAGNOSIS — O134 Gestational [pregnancy-induced] hypertension without significant proteinuria, complicating childbirth: Secondary | ICD-10-CM | POA: Diagnosis not present

## 2023-09-29 DIAGNOSIS — O1203 Gestational edema, third trimester: Secondary | ICD-10-CM | POA: Diagnosis not present

## 2023-09-29 DIAGNOSIS — Z87891 Personal history of nicotine dependence: Secondary | ICD-10-CM | POA: Diagnosis not present

## 2023-09-29 DIAGNOSIS — R200 Anesthesia of skin: Secondary | ICD-10-CM | POA: Diagnosis not present

## 2023-09-29 LAB — RPR: RPR Ser Ql: NONREACTIVE

## 2023-09-29 LAB — TYPE AND SCREEN
ABO/RH(D): O POS
Antibody Screen: NEGATIVE

## 2023-09-29 MED ORDER — OXYTOCIN-SODIUM CHLORIDE 30-0.9 UT/500ML-% IV SOLN
1.0000 m[IU]/min | INTRAVENOUS | Status: DC
  Administered 2023-09-30: 2 m[IU]/min via INTRAVENOUS
  Filled 2023-09-29: qty 500

## 2023-09-29 MED ORDER — OXYCODONE-ACETAMINOPHEN 5-325 MG PO TABS: 1.0000 | ORAL_TABLET | ORAL | Status: DC | PRN

## 2023-09-29 MED ORDER — OXYCODONE-ACETAMINOPHEN 5-325 MG PO TABS: 2.0000 | ORAL_TABLET | ORAL | Status: DC | PRN

## 2023-09-29 MED ORDER — MISOPROSTOL 50MCG HALF TABLET
50.0000 ug | ORAL_TABLET | ORAL | Status: AC
  Administered 2023-09-29 – 2023-09-30 (×2): 50 ug via ORAL
  Filled 2023-09-29 (×2): qty 1

## 2023-09-29 MED ORDER — OXYTOCIN BOLUS FROM INFUSION
333.0000 mL | Freq: Once | INTRAVENOUS | Status: AC
  Administered 2023-10-01: 333 mL via INTRAVENOUS

## 2023-09-29 MED ORDER — LACTATED RINGERS IV SOLN
500.0000 mL | INTRAVENOUS | Status: AC | PRN
  Administered 2023-09-30: 500 mL via INTRAVENOUS

## 2023-09-29 MED ORDER — MISOPROSTOL 25 MCG QUARTER TABLET
25.0000 ug | ORAL_TABLET | Freq: Once | ORAL | Status: AC
  Administered 2023-09-29: 25 ug via ORAL
  Filled 2023-09-29: qty 1

## 2023-09-29 MED ORDER — MISOPROSTOL 25 MCG QUARTER TABLET
25.0000 ug | ORAL_TABLET | Freq: Once | ORAL | Status: AC
  Administered 2023-09-29: 25 ug via VAGINAL
  Filled 2023-09-29: qty 1

## 2023-09-29 MED ORDER — LACTATED RINGERS IV SOLN: INTRAVENOUS | Status: AC

## 2023-09-29 MED ORDER — MISOPROSTOL 50MCG HALF TABLET
50.0000 ug | ORAL_TABLET | Freq: Once | ORAL | Status: AC
  Administered 2023-09-29: 50 ug via VAGINAL
  Filled 2023-09-29: qty 1

## 2023-09-29 MED ORDER — FENTANYL CITRATE (PF) 100 MCG/2ML IJ SOLN
50.0000 ug | INTRAMUSCULAR | Status: DC | PRN
  Administered 2023-09-30 (×3): 100 ug via INTRAVENOUS
  Filled 2023-09-29 (×3): qty 2

## 2023-09-29 MED ORDER — LIDOCAINE HCL (PF) 1 % IJ SOLN: 30.0000 mL | INTRAMUSCULAR | Status: DC | PRN

## 2023-09-29 MED ORDER — ACETAMINOPHEN 325 MG PO TABS: 650.0000 mg | ORAL_TABLET | ORAL | Status: DC | PRN

## 2023-09-29 MED ORDER — SOD CITRATE-CITRIC ACID 500-334 MG/5ML PO SOLN: 30.0000 mL | ORAL | Status: DC | PRN

## 2023-09-29 MED ORDER — MISOPROSTOL 50MCG HALF TABLET
50.0000 ug | ORAL_TABLET | Freq: Once | ORAL | Status: AC
  Administered 2023-09-29: 50 ug via BUCCAL
  Filled 2023-09-29: qty 1

## 2023-09-29 MED ORDER — OXYTOCIN-SODIUM CHLORIDE 30-0.9 UT/500ML-% IV SOLN
2.5000 [IU]/h | INTRAVENOUS | Status: DC
  Administered 2023-10-01: 2.5 [IU]/h via INTRAVENOUS

## 2023-09-29 MED ORDER — TERBUTALINE SULFATE 1 MG/ML IJ SOLN: 0.2500 mg | Freq: Once | INTRAMUSCULAR | Status: DC | PRN

## 2023-09-29 MED ORDER — ONDANSETRON HCL 4 MG/2ML IJ SOLN
4.0000 mg | Freq: Four times a day (QID) | INTRAMUSCULAR | Status: DC | PRN
  Filled 2023-09-29: qty 2

## 2023-09-29 NOTE — MAU Note (Signed)

## 2023-09-29 NOTE — Progress Notes (Signed)
 Norma Deleon is a 23 y.o. G1P0000 at [redacted]w[redacted]d by LMP admitted for induction of labor due to gestational hypertension at term. .  Subjective: Patient doing well. Introductions exchanged.   Objective: BP (!) 122/58   Pulse 71   Temp 98 F (36.7 C) (Oral)   Resp 18   Ht 5' 4 (1.626 m)   Wt 96 kg   LMP 12/26/2022 (Exact Date)   SpO2 99%   BMI 36.34 kg/m  No intake/output data recorded. No intake/output data recorded.  FHT:  FHR: 150 bpm, variability: moderate,  accelerations:  Present,  decelerations:  Absent UC:   irregular,  SVE:   Dilation: 1 Effacement (%): 60 Station: -1, -2 Exam by:: Emilio, CNM  Labs: Lab Results  Component Value Date   WBC 11.9 (H) 09/28/2023   HGB 11.4 (L) 09/28/2023   HCT 34.8 (L) 09/28/2023   MCV 86.8 09/28/2023   PLT 208 09/28/2023   CNM to patient bedside to discuss FB placement. Following exam, given posterior cervix and unable to grasp cervix successfully, patient not a candidate at this time. Plan to repeat Cytotec  dosing.   Assessment / Plan: Induction of labor due to gestational hypertension, s/p Dual cytotec  at 0600.   Labor: Plan for 50mcg buccal and reassess for FB vs AROM at next check  Preeclampsia:  labs stable Fetal Wellbeing:  Category I Pain Control:  Labor support without medications at this time  I/D:  GBS neg  Anticipated MOD:  hopeful for vaginal delivery   [name] Emilio, CNM 09/29/2023, 12:27 PM

## 2023-09-29 NOTE — Progress Notes (Signed)
 LABOR NOTE Norma Deleon is a 23 y.o. G1P0000 at [redacted]w[redacted]d admitted for induction of labor due to gestational hypertension   Subjective: feeling contractions some, but not uncomfortable  Objective: BP 130/71   Pulse 76   Temp 98 F (36.7 C) (Oral)   Resp 18   Ht 5' 4 (1.626 m)   Wt 96 kg   LMP 12/26/2022 (Exact Date)   SpO2 99%   BMI 36.34 kg/m  No intake/output data recorded.  FHR baseline 150 bpm, Variability: moderate, Accelerations:present, Decelerations:  Absent Toco: q 2-4 mins   SVE:   Dilation: 1.5 Effacement (%): 60, 70 Station: -2 Exam by:: Emilio, CNM  Labs: Lab Results  Component Value Date   WBC 11.9 (H) 09/28/2023   HGB 11.4 (L) 09/28/2023   HCT 34.8 (L) 09/28/2023   MCV 86.8 09/28/2023   PLT 208 09/28/2023     CNM to patient bedside to discuss placement for Foley Balloon. Reviewed risks and benefits with patient. Patient continues to decline FB. Recommended at least a repeat Cytotec  and patient agreeable to plan of care.    Assessment / Plan: IOL d/t gestational hypertension , s/p cytotec  x 4 (1 dual and buccal last one vaginal) - last dose at 1522.   Labor: cervical ripening phase. Encouraged frequent position changes, walking and miles circuit  Fetal Wellbeing:  Category I Pain Control:  labor support without medications Pre-eclampsia: bp's stable and labs stable I/D:  GBS neg Anticipated MOD: hopeful for vaginal birth  [name] Emilio MSN, CNM  09/29/2023, 5:45 PM

## 2023-09-29 NOTE — H&P (Addendum)
 OBSTETRIC ADMISSION HISTORY AND PHYSICAL  Norma Deleon is a 23 y.o. female G1P0000 with IUP at [redacted]w[redacted]d by LMP c/w 11 week US  presenting for IOL 2/2 gHTN. She reports +FMs, No LOF, no VB, no blurry vision, headaches or peripheral edema, and RUQ pain.  She plans on breast feeding. She request condoms for birth control. She received her prenatal care at [facility]   Dating: By LMP c/w 11 week US  --->  Estimated Date of Delivery: 10/02/23  Sono:    @[redacted]w[redacted]d , CWD, normal anatomy, cephalic presentation, anterior placental lie, 2437g, 58% EFW   Prenatal History/Complications: Correne Beagle for PN care  Past Medical History: History reviewed. No pertinent past medical history.  Past Surgical History: Past Surgical History:  Procedure Laterality Date   NO PAST SURGERIES      Obstetrical History: OB History     Gravida  1   Para  0   Term  0   Preterm  0   AB  0   Living  0      SAB  0   IAB  0   Ectopic  0   Multiple  0   Live Births  0           Social History Social History   Socioeconomic History   Marital status: Single    Spouse name: Not on file   Number of children: 0   Years of education: 11   Highest education level: High school graduate  Occupational History   Not on file  Tobacco Use   Smoking status: Former    Types: E-cigarettes, Cigars    Passive exposure: Past   Smokeless tobacco: Never  Vaping Use   Vaping status: Never Used  Substance and Sexual Activity   Alcohol use: Not Currently    Alcohol/week: 5.0 standard drinks of alcohol    Types: 5 Shots of liquor per week    Comment: occassionally   Drug use: Not Currently    Types: Marijuana    Comment: January 2025   Sexual activity: Yes    Partners: Male  Other Topics Concern   Not on file  Social History Narrative   Not on file   Social Drivers of Health   Financial Resource Strain: Not on file  Food Insecurity: No Food Insecurity (03/05/2023)   Hunger Vital Sign     Worried About Running Out of Food in the Last Year: Never true    Ran Out of Food in the Last Year: Never true  Transportation Needs: No Transportation Needs (03/05/2023)   PRAPARE - Administrator, Civil Service (Medical): No    Lack of Transportation (Non-Medical): No  Physical Activity: Not on file  Stress: No Stress Concern Present (03/05/2023)   [employer] of Occupational Health - Occupational Stress Questionnaire    Feeling of Stress : Not at all  Social Connections: Not on file    Family History: Family History  Problem Relation Age of Onset   Diabetes Mother    Heart disease Mother    Healthy Father    Healthy Maternal Grandmother    Heart failure Maternal Grandfather    Diabetes Maternal Grandfather    Kidney disease Maternal Grandfather    Healthy Paternal Grandmother    Healthy Paternal Grandfather     Allergies: No Known Allergies  Medications Prior to Admission  Medication Sig Dispense Refill Last Dose/Taking   Prenatal 28-0.8 MG TABS Take 1 tablet by mouth daily.  30 tablet 12 09/28/2023   acetaminophen  (TYLENOL ) 500 MG tablet Take 2 tablets (1,000 mg total) by mouth every 8 (eight) hours as needed for mild pain (pain score 1-3). (Patient not taking: Reported on 09/19/2023) 100 tablet 1    cyclobenzaprine  (FLEXERIL ) 10 MG tablet Take 1 tablet (10 mg total) by mouth 3 (three) times daily as needed for muscle spasms. 30 tablet 2    polyethylene glycol powder (GLYCOLAX /MIRALAX ) 17 GM/SCOOP powder Take 17 g by mouth daily as needed for moderate constipation. (Patient not taking: Reported on 09/19/2023) 255 g 0    Prenatal Vit-Fe Fumarate-FA (PRENATAL VITAMINS) 27-0.8 MG TABS Take 1 capsule by mouth daily.        Review of Systems   All systems reviewed and negative except as stated in HPI  Blood pressure (!) 141/74, pulse 87, temperature 98.9 F (37.2 C), temperature source Oral, resp. rate 18, height 5' 4 (1.626 m), weight 96 kg, last menstrual  period 12/26/2022, SpO2 99%. General appearance: alert, cooperative, and appears stated age Lungs: clear to auscultation bilaterally Heart: regular rate and rhythm Abdomen: soft, non-tender; bowel sounds normal Pelvic: adeqate Extremities: Homans sign is negative, no sign of DVT DTR's +2 Presentation: cephalic Fetal monitoring Baseline: 145 bpm, Variability: Good {> 6 bpm), Accelerations: Reactive, and Decelerations: Absent Uterine activity: Occasional contractions, mild in quality, a bit like cramps      Prenatal labs: ABO, Rh: --/--/O POS (08/16 0034) Antibody: NEG (08/16 0034) Rubella: 15.30 (02/05 1044) RPR: Non Reactive (05/27 1006)  HBsAg: Negative (02/05 1044)  HIV: Non Reactive (05/27 1006)  GBS: Negative/-- (07/23 1148)    Lab Results  Component Value Date   GBS Negative 09/05/2023   GTT WNL  Genetic screening  LR NIPS, female Anatomy US  WNL  Immunization History  Administered Date(s) Administered   PPD Test 08/02/2020    Prenatal Transfer Tool  Maternal Diabetes: No Genetic Screening: Normal Maternal Ultrasounds/Referrals: Normal Fetal Ultrasounds or other Referrals:  None Maternal Substance Abuse:  No Significant Maternal Medications:  None Significant Maternal Lab Results: Group B Strep negative Number of Prenatal Visits:greater than 3 verified prenatal visits Maternal Vaccinations:none Other Comments:  None   Results for orders placed or performed during the hospital encounter of 09/28/23 (from the past 24 hours)  Protein / creatinine ratio, urine   Collection Time: 09/28/23  9:26 PM  Result Value Ref Range   Creatinine, Urine 44 mg/dL   Total Protein, Urine 8 mg/dL   Protein Creatinine Ratio 0.18 (H) 0.00 - 0.15 mg/mg[Cre]  CBC   Collection Time: 09/28/23 10:07 PM  Result Value Ref Range   WBC 11.9 (H) 4.0 - 10.5 K/uL   RBC 4.01 3.87 - 5.11 MIL/uL   Hemoglobin 11.4 (L) 12.0 - 15.0 g/dL   HCT 65.1 (L) 63.9 - 53.9 %   MCV 86.8 80.0 - 100.0  fL   MCH 28.4 26.0 - 34.0 pg   MCHC 32.8 30.0 - 36.0 g/dL   RDW 86.1 88.4 - 84.4 %   Platelets 208 150 - 400 K/uL   nRBC 0.0 0.0 - 0.2 %  Comprehensive metabolic panel   Collection Time: 09/28/23 10:07 PM  Result Value Ref Range   Sodium 136 135 - 145 mmol/L   Potassium 3.6 3.5 - 5.1 mmol/L   Chloride 108 98 - 111 mmol/L   CO2 18 (L) 22 - 32 mmol/L   Glucose, Bld 105 (H) 70 - 99 mg/dL   BUN 7 6 - 20 mg/dL  Creatinine, Ser 0.54 0.44 - 1.00 mg/dL   Calcium 8.9 8.9 - 89.6 mg/dL   Total Protein 6.1 (L) 6.5 - 8.1 g/dL   Albumin 2.6 (L) 3.5 - 5.0 g/dL   AST 25 15 - 41 U/L   ALT 18 0 - 44 U/L   Alkaline Phosphatase 99 38 - 126 U/L   Total Bilirubin 0.5 0.0 - 1.2 mg/dL   GFR, Estimated >39 >39 mL/min   Anion gap 10 5 - 15  Type and screen [facility]   Collection Time: 09/29/23 12:34 AM  Result Value Ref Range   ABO/RH(D) O POS    Antibody Screen NEG    Sample Expiration      10/02/2023,2359 Performed at [facility], [address] [place], [place] [address]     Patient Active Problem List   Diagnosis Date Noted   Elevated BP without diagnosis of hypertension 08/12/2023   Group B streptococcal bacteriuria 06/12/2023   Supervision of normal pregnancy 03/05/2023   Overweight BMI=28.7 04/19/2021    Assessment/Plan:  KYNDRA CONDRON is a 23 y.o. G1P0000 at [redacted]w[redacted]d here for IOL 2/2 gHTN  #Labor:IOL in latent phase of labor. Confirmed vertex BSUS in MAU. Cervical ripening indicated, will plan dual route cytotec .  #Pain: Coping well, epidural or IV pain medication on request #FWB: Cat 1 #GBS status:  negative #Feeding: Breastmilk  #Reproductive Life planning: Condoms #Circ:  not applicable  Camie [name], CNM  09/29/2023, 4:00 AM

## 2023-09-29 NOTE — Progress Notes (Signed)
 Labor Progress Note Norma Deleon is a 23 y.o. G1P0000 at [redacted]w[redacted]d presented for IOL 2/2 GHTN  S:  Coping well  O:  BP 132/75   Pulse 83   Temp 98 F (36.7 C) (Oral)   Resp 16   Ht 5' 4 (1.626 m)   Wt 96 kg   LMP 12/26/2022 (Exact Date)   SpO2 99%   BMI 36.34 kg/m   EFM: baseline 150 bpm/ moderate variability/ 15x15 accels/ absent decels  Toco/IUPC: occasional UC SVE: Dilation: 1.5 Effacement (%): 80 Cervical Position: Posterior Station: -2 Presentation: Vertex Exam by:: [name], CNM Pitocin : 0 mu/min  A/P: 23 y.o. G1P0000 [redacted]w[redacted]d IOL 2/2 gHTN  1. Labor: IOL with minimal to no change since last exam. Continued cervical ripening indicated, RBA of FB reviewed with patient. Patient agrees to placement, Cook's used and filled with 60mL in uterine balloon only. Patient tolerated well. Will administer 5th dose of cytotec  - 50mcg oral.  2. FWB: Cat 1 3. Pain: Coping well, medication on request 4. GBS negative   Anticipate SVB.  Camie [name], CNM 9:35 PM

## 2023-09-30 LAB — CBC
HCT: 39.6 % (ref 36.0–46.0)
Hemoglobin: 12.9 g/dL (ref 12.0–15.0)
MCH: 28.4 pg (ref 26.0–34.0)
MCHC: 32.6 g/dL (ref 30.0–36.0)
MCV: 87 fL (ref 80.0–100.0)
Platelets: 244 K/uL (ref 150–400)
RBC: 4.55 MIL/uL (ref 3.87–5.11)
RDW: 14 % (ref 11.5–15.5)
WBC: 21.6 K/uL — ABNORMAL HIGH (ref 4.0–10.5)
nRBC: 0 % (ref 0.0–0.2)

## 2023-09-30 MED ORDER — PENICILLIN G POT IN DEXTROSE 60000 UNIT/ML IV SOLN
3.0000 10*6.[IU] | INTRAVENOUS | Status: DC
Start: 2023-09-30 — End: 2023-10-01
  Administered 2023-09-30 – 2023-10-01 (×5): 3 10*6.[IU] via INTRAVENOUS
  Filled 2023-09-30 (×7): qty 50

## 2023-09-30 MED ORDER — LACTATED RINGERS IV SOLN: 500.0000 mL | INTRAVENOUS | Status: AC | PRN

## 2023-09-30 MED ORDER — LACTATED RINGERS IV SOLN: INTRAVENOUS | Status: AC

## 2023-09-30 MED ORDER — SODIUM CHLORIDE 0.9 % IV SOLN
5.0000 10*6.[IU] | Freq: Once | INTRAVENOUS | Status: AC
  Administered 2023-09-30: 5 10*6.[IU] via INTRAVENOUS
  Filled 2023-09-30: qty 5

## 2023-09-30 NOTE — Progress Notes (Signed)
 Patient ID: [name] Minor, female   DOB: 07/29/2000, 23 y.o.   MRN: [id number]  Up to BR after sitting on ball; breathing well w ctx; wants to get another dose of Fentanyl  and try to rest  BPs 124/74, 139/74 FHR 150s, +accels, occ early variables, avg LTV Ctx q 3-5 mins Cx 4-5/80/vtx -2  IUP@39 .5wks gHTN IOL process  -Plan to check cx again around 1800 unless feeling urge to push prior -Anticipate vag delivery  Suzen JONETTA Gentry CNM 09/30/2023 4:51 PM

## 2023-09-30 NOTE — Progress Notes (Signed)
 Labor Progress Note [name] is a 23 y.o. G1P0000 at [redacted]w[redacted]d presented for IOL for gHTN  S:  Coping well.   O:  BP 131/78   Pulse 91   Temp 98 F (36.7 C) (Oral)   Resp 16   Ht 5' 4 (1.626 m)   Wt 96 kg   LMP 12/26/2022 (Exact Date)   SpO2 99%   BMI 36.34 kg/m   EFM: baseline 145 bpm/ moderate variability/ 15x15 accels/ absent decels  Toco/IUPC: 4-6 SVE: Dilation: 2.5 Effacement (%): 80, 90 Cervical Position: Posterior Station: -2 Presentation: Vertex Exam by:: Norma Deleon, CNM Pitocin : 0 mu/min  A/P: 23 y.o. G1P0000 [redacted]w[redacted]d  1. Labor: IOL in latent phase. Traction applied to FB, and checked around. Anticipate expulsion within the next few hours. Will have been in 12 hours at approximately 0930.  2. FWB: Cat 1 3. Pain: Coping well 4. GBS neg   Anticipate SVB.  Norma Norma Deleon, CNM 5:54 AM

## 2023-09-30 NOTE — Progress Notes (Signed)
 Labor Progress Note Norma Norma Deleon is a 23 y.o. G1P0000 at [redacted]w[redacted]d presented for IOL for gHTN   S:  Coping well, feeling some discouragement regarding minimal progress  O:  BP 135/71   Pulse (!) 116   Temp 98.5 F (36.9 C) (Oral)   Resp 20   Ht 5' 4 (1.626 m)   Wt 96 kg   LMP 12/26/2022 (Exact Date)   SpO2 100%   BMI 36.34 kg/m   EFM: baseline 150 bpm/ moderate variability/ 15x15 accels/ absent decels  Toco/IUPC: 1-3 SVE: Dilation: 4.5 Effacement (%): 90 Cervical Position: Posterior Station: 0 Presentation: Vertex Exam by:: Norma Butler Potters CNM Pitocin : 0 mu/min  A/P: 23 y.o. G1P0000 [redacted]w[redacted]d  IOL for gHTN with normotensive to mild range pressures here and normal labs 1. Labor: IOL in latent phase s/p cytotec  x6, FB and AROM. RBA of pitocin  d/w patient with patient opting to start pitocin  at this time. Discussed option to titrate to 12 and hold until 6cm and consider turning off at that point for endogenous labor to progress. Patient open to that plan. Hopeful for progress to active phase of labor with pitocin .  2. FWB: Cat 1  3. Pain: Coping well, discouraged with lack of progress. Feeling pressure. Position changes and nitrous oxide for pain relief. Epidural on request, but declines at this time and demonstrates good coping.  4. GBS neg on swab... however, GBS bacteruria present 03/21/23 will continue prophylaxis initiated by dayshift team.   Working toward SVB.  Norma Norma Deleon, CNM 9:30 PM

## 2023-09-30 NOTE — Progress Notes (Signed)
 Patient ID: Norma Deleon, female   DOB: [birth year], 23 y.o.   MRN: [id number]  S/p cytotec  x 6 doses and cervical foley; has showered and feels better!; requesting AROM as next plan as would like to avoid Pitocin  if possible  BPs 126/80, 131/78 FHR 150s, +accels, no decels Ctx irreg, mild Cx 3/60/vtx -2; AROM for light MSF  IUP@39 .5wks gHTN IOL process  -Would like to give membrane rupture a good chance to kickstart active labor -Chart review shows GBS bacteriuria on 03/21/23> will add in PCN for GBS ppx despite neg swab 09/05/23  Suzen JONETTA Gentry CNM 09/30/2023 10:24 AM

## 2023-09-30 NOTE — Progress Notes (Signed)
 Patient ID: Norma Deleon Minor, female   DOB: 11-22-2000, 23 y.o.   MRN: [id number]  Up on ball; feeling a little discouraged over lack of progress  BPs 124/74, 139/74 FHR 150s, decrease variability currently with some mild variables Ctx irreg 2-6 mins Cx 4-5/80/vtx -2 per RN exam at 1821  IUP@39 .5wks gHTN IOL process  Discussion regarding need for Pitocin  to keep labor progress going; she would like to try nitrous first to see if it helps with the discomfort, then most likely will be ready to start Pitocin   Norma Deleon Gentry [facility] 09/30/2023

## 2023-10-01 ENCOUNTER — Encounter (HOSPITAL_COMMUNITY): Payer: Self-pay | Admitting: Obstetrics and Gynecology

## 2023-10-01 ENCOUNTER — Inpatient Hospital Stay (HOSPITAL_COMMUNITY): Admitting: Anesthesiology

## 2023-10-01 DIAGNOSIS — Z3A39 39 weeks gestation of pregnancy: Secondary | ICD-10-CM

## 2023-10-01 DIAGNOSIS — O99820 Streptococcus B carrier state complicating pregnancy: Secondary | ICD-10-CM

## 2023-10-01 DIAGNOSIS — O134 Gestational [pregnancy-induced] hypertension without significant proteinuria, complicating childbirth: Secondary | ICD-10-CM

## 2023-10-01 LAB — CBC
HCT: 34.8 % — ABNORMAL LOW (ref 36.0–46.0)
Hemoglobin: 11.2 g/dL — ABNORMAL LOW (ref 12.0–15.0)
MCH: 28.3 pg (ref 26.0–34.0)
MCHC: 32.2 g/dL (ref 30.0–36.0)
MCV: 87.9 fL (ref 80.0–100.0)
Platelets: 277 K/uL (ref 150–400)
RBC: 3.96 MIL/uL (ref 3.87–5.11)
RDW: 14.2 % (ref 11.5–15.5)
WBC: 29.6 K/uL — ABNORMAL HIGH (ref 4.0–10.5)
nRBC: 0 % (ref 0.0–0.2)

## 2023-10-01 MED ORDER — SODIUM CHLORIDE 0.9% FLUSH: 3.0000 mL | Freq: Two times a day (BID) | INTRAVENOUS | Status: DC

## 2023-10-01 MED ORDER — ACETAMINOPHEN 325 MG PO TABS
650.0000 mg | ORAL_TABLET | ORAL | Status: DC | PRN
Start: 2023-10-01 — End: 2023-10-03

## 2023-10-01 MED ORDER — PHENYLEPHRINE 80 MCG/ML (10ML) SYRINGE FOR IV PUSH (FOR BLOOD PRESSURE SUPPORT)
80.0000 ug | PREFILLED_SYRINGE | INTRAVENOUS | Status: DC | PRN
  Administered 2023-10-01: 80 ug via INTRAVENOUS

## 2023-10-01 MED ORDER — DIBUCAINE (PERIANAL) 1 % EX OINT: 1.0000 | TOPICAL_OINTMENT | CUTANEOUS | Status: DC | PRN

## 2023-10-01 MED ORDER — EPHEDRINE 5 MG/ML INJ: 10.0000 mg | INTRAVENOUS | Status: DC | PRN

## 2023-10-01 MED ORDER — SODIUM CHLORIDE 0.9 % IV SOLN: 250.0000 mL | INTRAVENOUS | Status: DC | PRN

## 2023-10-01 MED ORDER — PHENYLEPHRINE 80 MCG/ML (10ML) SYRINGE FOR IV PUSH (FOR BLOOD PRESSURE SUPPORT)
80.0000 ug | PREFILLED_SYRINGE | INTRAVENOUS | Status: DC | PRN
  Administered 2023-10-01: 160 ug via INTRAVENOUS
  Filled 2023-10-01: qty 10

## 2023-10-01 MED ORDER — FUROSEMIDE 20 MG PO TABS
20.0000 mg | ORAL_TABLET | Freq: Every day | ORAL | Status: DC
  Administered 2023-10-01 – 2023-10-03 (×3): 20 mg via ORAL
  Filled 2023-10-01 (×3): qty 1

## 2023-10-01 MED ORDER — ONDANSETRON HCL 4 MG PO TABS: 4.0000 mg | ORAL_TABLET | ORAL | Status: DC | PRN

## 2023-10-01 MED ORDER — POTASSIUM CHLORIDE CRYS ER 20 MEQ PO TBCR: 40.0000 meq | EXTENDED_RELEASE_TABLET | Freq: Every day | ORAL | Status: DC

## 2023-10-01 MED ORDER — NIFEDIPINE ER OSMOTIC RELEASE 30 MG PO TB24
30.0000 mg | ORAL_TABLET | Freq: Every day | ORAL | Status: DC
  Filled 2023-10-01 (×2): qty 1

## 2023-10-01 MED ORDER — ONDANSETRON HCL 4 MG/2ML IJ SOLN: 4.0000 mg | INTRAMUSCULAR | Status: DC | PRN

## 2023-10-01 MED ORDER — LIDOCAINE HCL (PF) 1 % IJ SOLN
INTRAMUSCULAR | Status: DC | PRN
  Administered 2023-10-01 (×2): 4 mL via EPIDURAL

## 2023-10-01 MED ORDER — BENZOCAINE-MENTHOL 20-0.5 % EX AERO
1.0000 | INHALATION_SPRAY | CUTANEOUS | Status: DC | PRN
  Filled 2023-10-01: qty 56

## 2023-10-01 MED ORDER — SENNOSIDES-DOCUSATE SODIUM 8.6-50 MG PO TABS
2.0000 | ORAL_TABLET | ORAL | Status: DC
  Administered 2023-10-01 – 2023-10-03 (×3): 2 via ORAL
  Filled 2023-10-01 (×3): qty 2

## 2023-10-01 MED ORDER — TETANUS-DIPHTH-ACELL PERTUSSIS 5-2.5-18.5 LF-MCG/0.5 IM SUSY: 0.5000 mL | PREFILLED_SYRINGE | Freq: Once | INTRAMUSCULAR | Status: DC

## 2023-10-01 MED ORDER — TRANEXAMIC ACID-NACL 1000-0.7 MG/100ML-% IV SOLN
1000.0000 mg | INTRAVENOUS | Status: AC
  Administered 2023-10-01: 1000 mg via INTRAVENOUS

## 2023-10-01 MED ORDER — COCONUT OIL OIL
1.0000 | TOPICAL_OIL | Status: DC | PRN
Start: 2023-10-01 — End: 2023-10-03
  Administered 2023-10-02: 1 via TOPICAL

## 2023-10-01 MED ORDER — IBUPROFEN 800 MG PO TABS
800.0000 mg | ORAL_TABLET | Freq: Three times a day (TID) | ORAL | Status: DC
  Administered 2023-10-01 – 2023-10-03 (×6): 800 mg via ORAL
  Filled 2023-10-01 (×6): qty 1

## 2023-10-01 MED ORDER — OXYCODONE HCL 5 MG PO TABS: 5.0000 mg | ORAL_TABLET | ORAL | Status: DC | PRN

## 2023-10-01 MED ORDER — PRENATAL MULTIVITAMIN CH
1.0000 | ORAL_TABLET | Freq: Every day | ORAL | Status: DC
  Administered 2023-10-01 – 2023-10-03 (×3): 1 via ORAL
  Filled 2023-10-01 (×3): qty 1

## 2023-10-01 MED ORDER — LACTATED RINGERS IV SOLN
500.0000 mL | INTRAVENOUS | Status: DC | PRN
  Administered 2023-10-01: 500 mL via INTRAVENOUS

## 2023-10-01 MED ORDER — WITCH HAZEL-GLYCERIN EX PADS: 1.0000 | MEDICATED_PAD | CUTANEOUS | Status: DC | PRN

## 2023-10-01 MED ORDER — LACTATED RINGERS IV SOLN: 500.0000 mL | Freq: Once | INTRAVENOUS | Status: DC

## 2023-10-01 MED ORDER — FENTANYL-BUPIVACAINE-NACL 0.5-0.125-0.9 MG/250ML-% EP SOLN
EPIDURAL | Status: AC
  Filled 2023-10-01: qty 250

## 2023-10-01 MED ORDER — FUROSEMIDE 20 MG PO TABS: 20.0000 mg | ORAL_TABLET | Freq: Every day | ORAL | Status: DC

## 2023-10-01 MED ORDER — TRANEXAMIC ACID-NACL 1000-0.7 MG/100ML-% IV SOLN
INTRAVENOUS | Status: AC
  Filled 2023-10-01: qty 100

## 2023-10-01 MED ORDER — OXYCODONE HCL 5 MG PO TABS: 10.0000 mg | ORAL_TABLET | ORAL | Status: DC | PRN

## 2023-10-01 MED ORDER — LACTATED RINGERS IV SOLN: INTRAVENOUS | Status: DC

## 2023-10-01 MED ORDER — DIPHENHYDRAMINE HCL 50 MG/ML IJ SOLN: 12.5000 mg | INTRAMUSCULAR | Status: DC | PRN

## 2023-10-01 MED ORDER — SIMETHICONE 80 MG PO CHEW: 80.0000 mg | CHEWABLE_TABLET | ORAL | Status: DC | PRN

## 2023-10-01 MED ORDER — DIPHENHYDRAMINE HCL 25 MG PO CAPS: 25.0000 mg | ORAL_CAPSULE | Freq: Four times a day (QID) | ORAL | Status: DC | PRN

## 2023-10-01 MED ORDER — SODIUM CHLORIDE 0.9% FLUSH: 3.0000 mL | INTRAVENOUS | Status: DC | PRN

## 2023-10-01 MED ORDER — POTASSIUM CHLORIDE CRYS ER 20 MEQ PO TBCR
40.0000 meq | EXTENDED_RELEASE_TABLET | Freq: Every day | ORAL | Status: DC
  Administered 2023-10-01 – 2023-10-03 (×3): 40 meq via ORAL
  Filled 2023-10-01 (×4): qty 2

## 2023-10-01 MED ORDER — FENTANYL-BUPIVACAINE-NACL 0.5-0.125-0.9 MG/250ML-% EP SOLN
12.0000 mL/h | EPIDURAL | Status: DC | PRN
  Administered 2023-10-01: 12 mL/h via EPIDURAL

## 2023-10-01 NOTE — Lactation Note (Signed)
 This note was copied from a baby's chart. Lactation Consultation Note  Patient Name: Norma Deleon Date: 10/01/2023 Age:21 hours Reason for consult: Initial assessment;Mother's request;Primapara;1st time breastfeeding;Term;Breastfeeding assistance  P1- MOB called out for latching assistance. MOB reported that infant had been latching well so far, but she wanted the [facility] to check the latch. Infant was awake and rooting, so LC encouraged latching to the left breast in the football hold. LC demonstrated how to sandwich the breast and how to stroke her nipple from infant's nose to chin. Infant opened wide and latched deeply with flanged lips. Some nutritive sucks noted, but most were non-nutritive (chomping). MOB denied having any pain or pinching.  LC reviewed the first 24 hr birthday nap, day 2 cluster feeding, feeding infant on cue 8-12x in 24 hrs, not allowing infant to go over 3 hrs without a feeding, CDC milk storage guidelines, LC services handout and engorgement/breast care. LC encouraged MOB to call for further assistance as needed.  Maternal Data Has patient been taught Hand Expression?: Yes Does the patient have breastfeeding experience prior to this delivery?: No  Feeding Mother's Current Feeding Choice: Breast Milk  LATCH Score Latch: Repeated attempts needed to sustain latch, nipple held in mouth throughout feeding, stimulation needed to elicit sucking reflex.  Audible Swallowing: A few with stimulation  Type of Nipple: Everted at rest and after stimulation  Comfort (Breast/Nipple): Soft / non-tender  Hold (Positioning): Assistance needed to correctly position infant at breast and maintain latch.  LATCH Score: 7   Lactation Tools Discussed/Used Tools: Pump;Flanges Flange Size: 18 Breast pump type: Manual Pump Education: Setup, frequency, and cleaning;Milk Storage Reason for Pumping: MOB request Pumping frequency: 15-20 min every 3  hrs  Interventions Interventions: Breast feeding basics reviewed;Assisted with latch;Breast compression;Adjust position;Support pillows;Position options;Hand pump;Education;LC Services brochure  Discharge Discharge Education: Engorgement and breast care;Warning signs for feeding baby Pump: Manual;Hands Free;Personal  Consult Status Consult Status: Follow-up Date: 10/02/23 Follow-up type: In-patient    Recardo Hoit BS, IBCLC 10/01/2023, 7:04 PM

## 2023-10-01 NOTE — Anesthesia Procedure Notes (Signed)
 Epidural Patient location during procedure: OB Start time: 10/01/2023 1:20 AM End time: 10/01/2023 1:25 AM  Staffing Anesthesiologist: Peggye Delon Brunswick, MD Performed: anesthesiologist   Preanesthetic Checklist Completed: patient identified, IV checked, site marked, risks and benefits discussed, surgical consent, monitors and equipment checked, pre-op evaluation and timeout performed  Epidural Patient position: sitting Prep: DuraPrep and site prepped and draped Patient monitoring: continuous pulse ox and blood pressure Approach: midline Location: L3-L4 Injection technique: LOR saline  Needle:  Needle type: Tuohy  Needle gauge: 17 G Needle length: 9 cm and 9 Needle insertion depth: 6 cm Catheter type: closed end flexible Catheter size: 19 Gauge Catheter at skin depth: 11 cm Test dose: negative  Assessment Events: blood not aspirated, no cerebrospinal fluid, injection not painful, no injection resistance, no paresthesia and negative IV test  Additional Notes The patient has requested an epidural for labor pain management. Risks and benefits including, but not limited to, infection, bleeding, local anesthetic toxicity, headache, hypotension, back pain, block failure, etc. were discussed with the patient. The patient expressed understanding and consented to the procedure. I confirmed that the patient has no bleeding disorders and is not taking blood thinners. I confirmed the patient's last platelet count with the nurse. A time-out was performed immediately prior to the procedure. Please see nursing documentation for vital signs. Sterile technique was used throughout the whole procedure. Once LOR achieved, the epidural catheter threaded easily without resistance. Aspiration of the catheter was negative for blood and CSF. The epidural was dosed slowly and an infusion was started.  1 attempt(s)Reason for block:procedure for pain

## 2023-10-01 NOTE — Progress Notes (Signed)
 Labor Progress Note Norma Deleon is a 23 y.o. G1P0000 at [redacted]w[redacted]d presented for IOL due to gHTN  S: Comfortable w epidural, feeling intermittent pressure, but not as much as she would like.  O:  BP (!) 144/81   Pulse (!) 135   Temp 99 F (37.2 C) (Axillary)   Resp 18   Ht 5' 4 (1.626 m)   Wt 96 kg   LMP 12/26/2022 (Exact Date)   SpO2 99%   BMI 36.34 kg/m  EFM: 145/mod/-a/-d  CVE: Dilation: 10 Dilation Complete Date: 10/01/23 Dilation Complete Time: 0831 Effacement (%): 100 Cervical Position: Posterior Station: Plus 1, 0 Presentation: Vertex Exam by:: Lum Ernst RN   A&P: 22 y.o. G1P0000 [redacted]w[redacted]d   #Labor: Progressing well. Now complete, 0 station. She is not feeling constant pressure at this time. Was having occ lates, but not recurrent and overall has moderate variability on EFM. Discussed that we can labor down for now, but if there are any fetal tracing changes, would like patient to begin pushing asap. #Pain: Epidural #FWB: Cat I to Cat II, overall mod variability and no recurrent decels #GBS pos, adequately treated w PCN  #gHTN: BP mild range, isolated moderate range BP  asymptomatic  PEC w/up neg  Alain Sor, MD 9:29 AM

## 2023-10-01 NOTE — Anesthesia Preprocedure Evaluation (Signed)
 Anesthesia Evaluation  Patient identified by MRN, date of birth, ID band Patient awake    Reviewed: Allergy & Precautions, NPO status , Patient's Chart, lab work & pertinent test results  History of Anesthesia Complications Negative for: history of anesthetic complications  Airway Mallampati: III  TM Distance: >3 FB Neck ROM: Full    Dental   Pulmonary former smoker   Pulmonary exam normal breath sounds clear to auscultation       Cardiovascular hypertension (gestational),  Rhythm:Regular Rate:Normal     Neuro/Psych negative neurological ROS     GI/Hepatic negative GI ROS, Neg liver ROS,,,  Endo/Other  negative endocrine ROS    Renal/GU negative Renal ROS     Musculoskeletal   Abdominal  (+) + obese  Peds  Hematology negative hematology ROS (+) Lab Results      Component                Value               Date                      WBC                      21.6 (H)            09/30/2023                HGB                      12.9                09/30/2023                HCT                      39.6                09/30/2023                MCV                      87.0                09/30/2023                PLT                      244                 09/30/2023              Anesthesia Other Findings   Reproductive/Obstetrics (+) Pregnancy                              Anesthesia Physical Anesthesia Plan  ASA: 2  Anesthesia Plan: Epidural   Post-op Pain Management:    Induction:   PONV Risk Score and Plan:   Airway Management Planned: Natural Airway  Additional Equipment:   Intra-op Plan:   Post-operative Plan:   Informed Consent: I have reviewed the patients History and Physical, chart, labs and discussed the procedure including the risks, benefits and alternatives for the proposed anesthesia with the patient or authorized representative who has indicated his/her  understanding and acceptance.       Plan Discussed with: Anesthesiologist  Anesthesia  Plan Comments: (I have discussed risks of neuraxial anesthesia including but not limited to infection, bleeding, nerve injury, back pain, headache, seizures, and failure of block. Patient denies bleeding disorders and is not currently anticoagulated. Labs have been reviewed. Risks and benefits discussed. All patient's questions answered.  )         Anesthesia Quick Evaluation

## 2023-10-01 NOTE — Progress Notes (Signed)
 Labor Progress Note JANANI CHAMBER is a 23 y.o. G1P0000 at [redacted]w[redacted]d presented for IOL for gHTN  S:  Comfortable s/p epidural   O:  BP (!) 117/90   Pulse (!) 203   Temp 98.3 F (36.8 C) (Oral)   Resp 18   Ht 5' 4 (1.626 m)   Wt 96 kg   LMP 12/26/2022 (Exact Date)   SpO2 99%   BMI 36.34 kg/m   EFM: baseline 140 bpm/ moderate variability/ 15x15 accels/ late/variable decels  Toco/IUPC: 4-7  SVE: Dilation: Lip/rim Effacement (%): 90 Cervical Position: Posterior Station: 0 Presentation: Vertex Exam by:: [name] CNM Pitocin : 8 mu/min  A/P: 23 y.o. G1P0000 [redacted]w[redacted]d IOL for gHTN 1. Labor: IOL now in active phase s/p cytotec , FB, AROM, pitocin . Good progress since pitocin  and epidural placement. Anticipate SVB shortly.  2. FWB: Cat 2, overall reassuring 3. Pain: Comfortable s/p epidural 4. GBS bacteruria in pregnancy- adequate tx   Anticipate SVB.  Camie [name], CNM 3:57 AM

## 2023-10-01 NOTE — Discharge Summary (Signed)
 Postpartum Discharge Summary  Date of Service updated***     Patient Name: Norma Deleon DOB: 2001-01-17 MRN: [id number]  Date of admission: 09/28/2023 Delivery date:10/01/2023 Delivering provider: VON REASONER Date of discharge: 10/01/2023  Admitting diagnosis: Gestational hypertension [O13.9] Intrauterine pregnancy: [redacted]w[redacted]d     Secondary diagnosis:  Principal Problem:   SVD (spontaneous vaginal delivery) Active Problems:   Group B streptococcal bacteriuria   Gestational hypertension  Additional problems: ***    Discharge diagnosis: Term Pregnancy Delivered and Gestational Hypertension                                              Post partum procedures:{Postpartum procedures:23558} Augmentation: AROM, Pitocin , Cytotec , and IP Foley Complications: {OB Labor/Delivery Complications:20784}  Hospital course: Induction of Labor With Vaginal Delivery   23 y.o. yo G1P0000 at [redacted]w[redacted]d was admitted to the hospital 09/28/2023 for induction of labor.  Indication for induction: Gestational hypertension.  Patient had an labor course complicated by nothing. Delivery c/b extensive bilateral sulcal tears. EBL 943. Membrane Rupture Time/Date: 10:17 AM,09/30/2023  Delivery Method:Vaginal, Spontaneous Operative Delivery:N/A Episiotomy: None Lacerations:  2nd degree;Perineal;Vaginal;Sulcus;Labial Details of delivery can be found in separate delivery note.  Patient had a postpartum course complicated by***. Patient is discharged home 10/01/23.  Newborn Data: Birth date:10/01/2023 Birth time:10:41 AM Gender:Female Living status:Living Apgars:8 ,9  Weight:   Magnesium Sulfate received: {Mag received:30440022} BMZ received: No Rhophylac:N/A MMR:N/A T-DaP:*** Flu: No RSV Vaccine received: No Transfusion:{Transfusion received:30440034}  Immunizations received: Immunization History  Administered Date(s) Administered   PPD Test 08/02/2020    Physical exam  Vitals:   10/01/23 1100  10/01/23 1115 10/01/23 1130 10/01/23 1145  BP: 131/69 129/69 126/84 (!) 134/110  Pulse: (!) 108 (!) 119 (!) 126 (!) 126  Resp:      Temp:      TempSrc:      SpO2:      Weight:      Height:       General: {Exam; general:21111117} Lochia: {Desc; appropriate/inappropriate:30686::appropriate} Uterine Fundus: {Desc; firm/soft:30687} Incision: {Exam; incision:21111123} DVT Evaluation: {Exam; dvt:2111122} Labs: Lab Results  Component Value Date   WBC 21.6 (H) 09/30/2023   HGB 12.9 09/30/2023   HCT 39.6 09/30/2023   MCV 87.0 09/30/2023   PLT 244 09/30/2023      Latest Ref Rng & Units 09/28/2023   10:07 PM  CMP  Glucose 70 - 99 mg/dL 894   BUN 6 - 20 mg/dL 7   Creatinine 9.55 - 8.99 mg/dL 9.45   Sodium 864 - 854 mmol/L 136   Potassium 3.5 - 5.1 mmol/L 3.6   Chloride 98 - 111 mmol/L 108   CO2 22 - 32 mmol/L 18   Calcium 8.9 - 10.3 mg/dL 8.9   Total Protein 6.5 - 8.1 g/dL 6.1   Total Bilirubin 0.0 - 1.2 mg/dL 0.5   Alkaline Phos 38 - 126 U/L 99   AST 15 - 41 U/L 25   ALT 0 - 44 U/L 18    Edinburgh Score:     No data to display         No data recorded  After visit meds:  Allergies as of 10/01/2023   No Known Allergies   Med Rec must be completed prior to using this [facility]***        Discharge home in stable condition Infant Feeding: {  Baby feeding:23562} Infant Disposition:{CHL IP OB HOME WITH FNUYZM:76418} Discharge instruction: per After Visit Summary and Postpartum booklet. Activity: Advance as tolerated. Pelvic rest for 6 weeks.  Diet: {OB ipzu:78888878} Future Appointments:No future appointments. Follow up Visit:  Follow-up Information     [facility] for [facility] Follow up.   Specialty: Obstetrics and Gynecology Why: If symptoms worsen or fail to resolve, As scheduled for ongoing prenatal care Contact information: [address] [place]  [address] [phone]                Message sent to [facility] 8/18  Please schedule this patient for a In person postpartum visit in 6 weeks with the following provider: Any provider. Additional Postpartum F/U:Incision check 1 week and BP check 1 week  High risk pregnancy complicated by: HTN Delivery mode:  Vaginal, Spontaneous Anticipated Birth Control:  Condoms   10/01/2023 Alain Sor, MD

## 2023-10-02 LAB — CBC
HCT: 25.8 % — ABNORMAL LOW (ref 36.0–46.0)
Hemoglobin: 8.3 g/dL — ABNORMAL LOW (ref 12.0–15.0)
MCH: 28.4 pg (ref 26.0–34.0)
MCHC: 32.2 g/dL (ref 30.0–36.0)
MCV: 88.4 fL (ref 80.0–100.0)
Platelets: 252 K/uL (ref 150–400)
RBC: 2.92 MIL/uL — ABNORMAL LOW (ref 3.87–5.11)
RDW: 14.6 % (ref 11.5–15.5)
WBC: 18.8 K/uL — ABNORMAL HIGH (ref 4.0–10.5)
nRBC: 0 % (ref 0.0–0.2)

## 2023-10-02 MED ORDER — FERROUS SULFATE 325 (65 FE) MG PO TABS
325.0000 mg | ORAL_TABLET | ORAL | Status: DC
  Administered 2023-10-02: 325 mg via ORAL
  Filled 2023-10-02: qty 1

## 2023-10-02 NOTE — Progress Notes (Signed)
 POSTPARTUM PROGRESS NOTE  Post Partum Day 1  Subjective:  Norma Deleon is a 23 y.o. G1P1001 s/p SVD at [redacted]w[redacted]d.  She reports she is doing well. No acute events overnight. She denies any problems with ambulating, voiding or po intake. Denies nausea or vomiting.  Pain is well controlled.  Lochia is equivalent to a period. Reports some numbness still in left great toe, no where else though.  Objective: Blood pressure 127/72, pulse 99, temperature 98.1 F (36.7 C), temperature source Oral, resp. rate 17, height 5' 4 (1.626 m), weight 96 kg, last menstrual period 12/26/2022, SpO2 99%, unknown if currently breastfeeding.  Physical Exam:  General: alert, cooperative and no distress Chest: no respiratory distress Heart:regular rate, distal pulses intact Uterine Fundus: firm, appropriately tender DVT Evaluation: No calf swelling or tenderness Extremities: No edema Skin: warm, dry  Recent Labs    10/01/23 1222 10/02/23 1500  HGB 11.2* 8.3*  HCT 34.8* 25.8*    Assessment/Plan: Norma Deleon is a 23 y.o. G1P1001 s/p SVD at [redacted]w[redacted]d   PPD#1 - Doing well  Routine postpartum care  N/T of LLE - Will re-eval tomorrow and consider reaching out to PT vs anesthesia if unchanged   Contraception: Condoms Feeding: Breast Dispo: Plan for discharge tomorrow.   LOS: 3 days   Alain Sor, MD OB Fellow  10/02/2023, 4:29 PM

## 2023-10-02 NOTE — Lactation Note (Signed)
 This note was copied from a baby's chart. Lactation Consultation Note  Patient Name: Norma Deleon Date: 10/02/2023 Age:21 hours Reason for consult: Follow-up assessment;Mother's request;Difficult latch;Infant weight loss (Infant with weight loss -3.55 %, See MOB : MR infant is DAT+ and MOB with hx GHTN.)  C/O : Infant DAT+, hx small intervals of feedings 2-3 minutes, LC observed infant sucks her tongue and lifts to the roof of the hard palate. MOB started using the DEBP due infant having hx of poor feedings, by RN LC re-sized to 18 mm breast flange.   MOB was given pillow support, LC assisted with latch, did a lot of suck training and extending infant's lower jaw prior to latch. MOB latched infant on her  right breast using the football hold position, infant was on and off the breast but was improving her latch, infant breastfeed for 14 minutes on her right breast . LC reviewed hand expression using breast model and MOB self expressed infant was given 3 mls of colostrum by spoon. Infant was  still breastfeeding after 17 minutes when LC left the room.   LC discussed hunger cues, signs of satiety and cluster feeding on Day 2 of life. MOB knows she can hand express and give infant any EBM for hand expressing and using the DEBP.   Current Feeding Plan: 1- MOB will continue to latch infant by cues, on, demand, 8-12 times within 24 hours, skin to skin. 2- MOB knows to call for further latch assistance if needed. 3- MOB will give infant back any EBM from hand expression and using the DEBP.    Maternal Data Has patient been taught Hand Expression?: Yes Does the patient have breastfeeding experience prior to this delivery?: No  Feeding Mother's Current Feeding Choice: Breast Milk  LATCH Score Latch: Repeated attempts needed to sustain latch, nipple held in mouth throughout feeding, stimulation needed to elicit sucking reflex.  Audible Swallowing: A few with stimulation  Type of  Nipple: Everted at rest and after stimulation  Comfort (Breast/Nipple): Soft / non-tender  Hold (Positioning): Assistance needed to correctly position infant at breast and maintain latch.  LATCH Score: 7   Lactation Tools Discussed/Used Tools: Pump Breast pump type: Double-Electric Breast Pump Pump Education: Setup, frequency, and cleaning;Milk Storage Reason for Pumping: RN set MOB up with DEBP due to infant being DAT+ will monitor billirubin levels. Pumping frequency: MOB will continue to pump every 3 hours for 15 minutes and give infant back any EBM that is pumped.  Interventions Interventions: Assisted with latch;Skin to skin;Breast compression;Hand express;Adjust position;Support pillows;Position options;Expressed milk;DEBP;Education;Guidelines for Milk Supply and Pumping Schedule Handout;CDC milk storage guidelines;CDC Guidelines for Breast Pump Cleaning  Discharge Pump: DEBP;Personal  Consult Status Consult Status: Follow-up Date: 10/03/23 Follow-up type: In-patient    Norma Deleon 10/02/2023, 6:32 PM

## 2023-10-02 NOTE — Lactation Note (Signed)
 This note was copied from a baby's chart. Lactation Consultation Note  Patient Name: Norma Deleon Unijb'd Date: 10/02/2023 Age:21 hours Reason for consult: Mother's request(Infant with weight loss -3.55 %, See MOB : MR infant is DAT+ and MOB with hx GHTN.)  C/O requested latch assistance.  Infant fussy prior to latch, MOB expressed 1 ml given infant by spoon, infant appeared calmer. MOB latched infant on her right breast using the football hold with pillow support. Infant sustaining latch with this feeding. Infant was still breastfeeding after 5 minutes when LC left the room. MOB will continue to work on latching infant at the breast. MOB is pumping every 3 hours for 15 minutes due to infant being DAT+ and while she is working on infant latching longer duration at the breast. MOB knows that infant is currently cluster feeding and this is normal pattern of behavior. MOB knows to call for further latch assistance as needed.    Maternal Data    Feeding Mother's Current Feeding Choice: Breast Milk  LATCH Score Latch: Grasps breast easily, tongue down, lips flanged, rhythmical sucking.  Audible Swallowing: A few with stimulation  Type of Nipple: Everted at rest and after stimulation  Comfort (Breast/Nipple): Soft / non-tender  Hold (Positioning): Assistance needed to correctly position infant at breast and maintain latch.  LATCH Score: 8   Lactation Tools Discussed/Used    Interventions Interventions: Skin to skin;Assisted with latch;Position options;Support pillows;Adjust position;Hand express;Expressed milk;Education  Discharge    Consult Status Consult Status: Follow-up Date: 10/03/23 Follow-up type: In-patient    [name] 10/02/2023, 10:12 PM

## 2023-10-02 NOTE — Anesthesia Postprocedure Evaluation (Signed)
 Anesthesia Post Note  Patient: Norma Deleon  Procedure(s) Performed: AN AD HOC LABOR EPIDURAL     Patient location during evaluation: Mother Baby Anesthesia Type: Epidural Level of consciousness: awake and alert Pain management: pain level controlled Vital Signs Assessment: post-procedure vital signs reviewed and stable Respiratory status: spontaneous breathing, nonlabored ventilation and respiratory function stable Cardiovascular status: stable Postop Assessment: no headache, no backache, able to ambulate, adequate PO intake, no apparent nausea or vomiting and patient able to bend at knees Anesthetic complications: no Comments: Patient having dizzieness. Hgb 8.3 at 1500 today. Provider notified.   No notable events documented.  Last Vitals:  Vitals:   10/02/23 0517 10/02/23 1607  BP: 119/62 127/72  Pulse: (!) 102 99  Resp: 18 17  Temp: 36.9 C 36.7 C  SpO2: 99%     Last Pain:  Vitals:   10/02/23 1607  TempSrc: Oral  PainSc:    Pain Goal:                   [name]

## 2023-10-03 ENCOUNTER — Other Ambulatory Visit (HOSPITAL_COMMUNITY): Payer: Self-pay

## 2023-10-03 ENCOUNTER — Encounter: Admitting: Certified Nurse Midwife

## 2023-10-03 MED ORDER — WITCH HAZEL-GLYCERIN EX PADS: 1.0000 | MEDICATED_PAD | CUTANEOUS | Status: DC | PRN

## 2023-10-03 MED ORDER — FUROSEMIDE 20 MG PO TABS
20.0000 mg | ORAL_TABLET | Freq: Every day | ORAL | 0 refills | Status: DC
  Filled 2023-10-03: qty 5, 5d supply, fill #0

## 2023-10-03 MED ORDER — BENZOCAINE-MENTHOL 20-0.5 % EX AERO: 1.0000 | INHALATION_SPRAY | CUTANEOUS | Status: DC | PRN

## 2023-10-03 MED ORDER — NIFEDIPINE ER 30 MG PO TB24
30.0000 mg | ORAL_TABLET | Freq: Every day | ORAL | 1 refills | Status: DC
  Filled 2023-10-03: qty 30, 30d supply, fill #0

## 2023-10-03 MED ORDER — POTASSIUM CHLORIDE CRYS ER 20 MEQ PO TBCR
20.0000 meq | EXTENDED_RELEASE_TABLET | Freq: Every day | ORAL | 0 refills | Status: DC
  Filled 2023-10-03: qty 5, 5d supply, fill #0

## 2023-10-03 MED ORDER — IBUPROFEN 800 MG PO TABS
800.0000 mg | ORAL_TABLET | Freq: Three times a day (TID) | ORAL | 0 refills | Status: DC
  Filled 2023-10-03: qty 30, 10d supply, fill #0

## 2023-10-03 MED ORDER — FERROUS SULFATE 325 (65 FE) MG PO TABS
325.0000 mg | ORAL_TABLET | ORAL | 1 refills | Status: DC
  Filled 2023-10-03: qty 45, 90d supply, fill #0

## 2023-10-03 MED ORDER — COCONUT OIL OIL: 1.0000 | TOPICAL_OIL | Status: DC | PRN

## 2023-10-03 MED ORDER — ACETAMINOPHEN 325 MG PO TABS: 650.0000 mg | ORAL_TABLET | ORAL | Status: DC | PRN

## 2023-10-03 MED ORDER — ACCRUFER 30 MG PO CAPS
1.0000 | ORAL_CAPSULE | Freq: Every day | ORAL | 2 refills | Status: DC
  Filled 2023-10-03: qty 30, 30d supply, fill #0

## 2023-10-03 NOTE — Lactation Note (Signed)
 This note was copied from a baby's chart. Lactation Consultation Note  Patient Name: Norma Deleon Date: 10/03/2023 Age:21 hours  Reason for consult: Follow-up assessment;Primapara;1st time breastfeeding;Term;Breastfeeding assistance;Mother's request  P1, [redacted]w[redacted]d, 6.6% weight loss  Follow up LC visit. P1 mother has been exclusively breastfeeding. Mother called out for assistance because baby was having problems sustaining latch.  Mother has tubular shaped breast, slightly widely spaced and she is able to express flowing colostrum with ease during hand expression. Basic breastfeeding assistance in cross cradle and football. Baby and mother prefer football at this time. After a few attempts, baby latched with wide gape, rhythmic sucking, and audible swallows. Mother denied discomfort. Mother was taught how to do alternate breast compression to increase intake.   Mother is very motivated to breastfeed and she has good family support present. Mother and baby have an OP LC appointment at [facility] for breastfeeding support and assessment on 10/12/23.    Feeding Mother's Current Feeding Choice: Breast Milk  LATCH Score Latch: Grasps breast easily, tongue down, lips flanged, rhythmical sucking.  Audible Swallowing: Spontaneous and intermittent  Type of Nipple: Everted at rest and after stimulation  Comfort (Breast/Nipple): Soft / non-tender  Hold (Positioning): Assistance needed to correctly position infant at breast and maintain latch.  LATCH Score: 9      Interventions Interventions: Breast feeding basics reviewed;Breast compression;Assisted with latch;Skin to skin;Support pillows;Education  Discharge Discharge Education: Engorgement and breast care;Warning signs for feeding baby Pump: DEBP;Personal  Consult Status Consult Status: Complete Date: 10/03/23    Joshua Rojelio HERO 10/03/2023, 1:04 PM

## 2023-10-03 NOTE — Patient Instructions (Signed)
 Your appointment with Outpatient Lactation is: Date: 10/12/2023 Time: 11:30 AM [facility] (First Floor) [address]  [place]  Check in under baby's name.  Please bring your baby hungry along with your pump and a bottle of either formula or expressed breast milk. Please also bring your pump flanges and we welcome support people! If you need lactation assistance before your appointment, please call [phone] and press 4 for lactation.   If interested in an outpatient lactation consult in office or virtually please reach out to us  at [facility] (First Floor) [address] [place] Please call [phone] and press 4 for lactation.    Lactation support groups:  [facility], Tuesdays 10:00 am -12:00 pm at [address] on the second floor in the conference room, lactating parents and lap babies welcome.  [email or web address]  [email or web address]   Geraldina Louder, [facility] for [facility]

## 2023-10-08 ENCOUNTER — Ambulatory Visit

## 2023-10-08 VITALS — BP 128/83 | HR 93 | Wt 186.0 lb

## 2023-10-08 DIAGNOSIS — Z0130 Encounter for examination of blood pressure without abnormal findings: Secondary | ICD-10-CM

## 2023-10-08 DIAGNOSIS — O133 Gestational [pregnancy-induced] hypertension without significant proteinuria, third trimester: Secondary | ICD-10-CM

## 2023-10-08 NOTE — Progress Notes (Signed)
 Subjective:  Norma Deleon is a G1P1001 here for BP check.  She is 1 weeks postpartum following a normal spontaneous vaginal delivery.    Hypertension ROS: Patient denies headache, visual changes.   Objective:  LMP 12/26/2022 (Exact Date)   Appearance alert, well appearing, and in no distress.  Assessment:   Blood Pressure well controlled.   Plan:  Keep PP Appt as scheduled discussed precautions and made pt aware to reach out with any concerns.   Norma Deleon, CMA

## 2023-10-10 ENCOUNTER — Inpatient Hospital Stay (HOSPITAL_COMMUNITY)

## 2023-10-25 ENCOUNTER — Ambulatory Visit: Payer: Self-pay

## 2023-10-25 ENCOUNTER — Ambulatory Visit

## 2023-10-25 VITALS — BP 118/76 | HR 85 | Temp 98.4°F | Ht 64.25 in | Wt 185.0 lb

## 2023-10-25 DIAGNOSIS — E669 Obesity, unspecified: Secondary | ICD-10-CM | POA: Insufficient documentation

## 2023-10-25 DIAGNOSIS — Z0000 Encounter for general adult medical examination without abnormal findings: Secondary | ICD-10-CM

## 2023-10-25 DIAGNOSIS — D649 Anemia, unspecified: Secondary | ICD-10-CM | POA: Diagnosis not present

## 2023-10-25 DIAGNOSIS — Z23 Encounter for immunization: Secondary | ICD-10-CM | POA: Diagnosis not present

## 2023-10-25 LAB — CBC WITH DIFFERENTIAL/PLATELET
Basophils Absolute: 0 K/uL (ref 0.0–0.1)
Basophils Relative: 0.2 % (ref 0.0–3.0)
Eosinophils Absolute: 0.5 K/uL (ref 0.0–0.7)
Eosinophils Relative: 4.8 % (ref 0.0–5.0)
HCT: 36.3 % (ref 36.0–46.0)
Hemoglobin: 11.6 g/dL — ABNORMAL LOW (ref 12.0–15.0)
Lymphocytes Relative: 22.1 % (ref 12.0–46.0)
Lymphs Abs: 2.1 K/uL (ref 0.7–4.0)
MCHC: 31.8 g/dL (ref 30.0–36.0)
MCV: 84.8 fl (ref 78.0–100.0)
Monocytes Absolute: 0.6 K/uL (ref 0.1–1.0)
Monocytes Relative: 6.3 % (ref 3.0–12.0)
Neutro Abs: 6.4 K/uL (ref 1.4–7.7)
Neutrophils Relative %: 66.6 % (ref 43.0–77.0)
Platelets: 378 K/uL (ref 150.0–400.0)
RBC: 4.28 Mil/uL (ref 3.87–5.11)
RDW: 15.2 % (ref 11.5–15.5)
WBC: 9.7 K/uL (ref 4.0–10.5)

## 2023-10-25 NOTE — Progress Notes (Signed)
 Subjective:    Patient ID: Norma Deleon, female    DOB: 02-17-2000, 23 y.o.   MRN: [id number]   Norma Deleon is a very pleasant 23 y.o. female who presents today as a new patient.  Past medical, surgical and family history: Reviewed and updated in chart.  Allergies: Reviewed and updated in chart.  Medications: Reviewed and updated in chart.  Social history: Reviewed and updated in chart.  Last PCP and reason for leaving: [facility], last seen more than 1y ago  Today, wants a physical.  She did give birth to her baby girl a few weeks ago.   Review of Systems  All other systems reviewed and are negative.        History reviewed. No pertinent past medical history.  Social History   Socioeconomic History   Marital status: Single    Spouse name: Not on file   Number of children: 0   Years of education: 11   Highest education level: High school graduate  Occupational History   Not on file  Tobacco Use   Smoking status: Former    Types: E-cigarettes, Cigars    Passive exposure: Past   Smokeless tobacco: Never  Vaping Use   Vaping status: Never Used  Substance and Sexual Activity   Alcohol use: Not Currently    Alcohol/week: 5.0 standard drinks of alcohol    Types: 5 Shots of liquor per week    Comment: occassionally   Drug use: Not Currently    Types: Marijuana    Comment: January 2025   Sexual activity: Yes    Partners: Male  Other Topics Concern   Not on file  Social History Narrative   Lives in house with mum, infant daughter and grandparents, currently unemployed, was previously working doing Forensic psychologist, not currently sexually active.   Social Drivers of Corporate investment banker Strain: Not on file  Food Insecurity: Patient Declined (09/29/2023)   Hunger Vital Sign    Worried About Running Out of Food in the Last Year: Patient declined    Ran Out of Food in the Last Year: Patient declined  Transportation Needs: Patient Declined  (09/29/2023)   PRAPARE - Administrator, Civil Service (Medical): Patient declined    Lack of Transportation (Non-Medical): Patient declined  Physical Activity: Not on file  Stress: No Stress Concern Present (03/05/2023)   [employer] of Occupational Health - Occupational Stress Questionnaire    Feeling of Stress : Not at all  Social Connections: Not on file  Intimate Partner Violence: Patient Declined (09/29/2023)   Humiliation, Afraid, Rape, and Kick questionnaire    Fear of Current or Ex-Partner: Patient declined    Emotionally Abused: Patient declined    Physically Abused: Patient declined    Sexually Abused: Patient declined    Past Surgical History:  Procedure Laterality Date   NO PAST SURGERIES      Family History  Problem Relation Age of Onset   Diabetes Mother    Heart disease Mother    Healthy Father    Healthy Maternal Grandmother    Heart failure Maternal Grandfather    Diabetes Maternal Grandfather    Kidney disease Maternal Grandfather    Healthy Paternal Grandmother    Healthy Paternal Grandfather     No Known Allergies  Current Outpatient Medications on File Prior to Visit  Medication Sig Dispense Refill   acetaminophen  (TYLENOL ) 500 MG tablet Take 2 tablets (1,000 mg total)  by mouth every 8 (eight) hours as needed for mild pain (pain score 1-3). 100 tablet 1   coconut oil OIL Apply 1 Application topically as needed.     ibuprofen  (ADVIL ) 800 MG tablet Take 1 tablet (800 mg total) by mouth every 8 (eight) hours. 30 tablet 0   Prenatal 28-0.8 MG TABS Take 1 tablet by mouth daily. 30 tablet 12   Prenatal Vit-Fe Fumarate-FA (PRENATAL VITAMINS) 27-0.8 MG TABS Take 1 capsule by mouth daily.     witch hazel-glycerin  (TUCKS) pad Apply 1 Application topically as needed for hemorrhoids.     No current facility-administered medications on file prior to visit.    BP 118/76   Pulse 85   Temp 98.4 F (36.9 C) (Oral)   Ht 5' 4.25 (1.632 m)   Wt  185 lb (83.9 kg)   LMP 12/26/2022 (Exact Date)   SpO2 98%   Breastfeeding Yes   BMI 31.51 kg/m   Objective:    Physical Exam Vitals and nursing note reviewed.  Constitutional:      Appearance: Normal appearance.  HENT:     Head: Normocephalic and atraumatic.     Right Ear: Tympanic membrane, ear canal and external ear normal.     Left Ear: Tympanic membrane, ear canal and external ear normal. There is no impacted cerumen.     Nose: No congestion or rhinorrhea.     Mouth/Throat:     Mouth: Mucous membranes are moist.     Pharynx: No oropharyngeal exudate or posterior oropharyngeal erythema.  Eyes:     Extraocular Movements: Extraocular movements intact.     Conjunctiva/sclera: Conjunctivae normal.     Pupils: Pupils are equal, round, and reactive to light.  Cardiovascular:     Rate and Rhythm: Normal rate and regular rhythm.     Heart sounds: No murmur heard. Pulmonary:     Effort: Pulmonary effort is normal.     Breath sounds: Normal breath sounds.  Abdominal:     General: Abdomen is flat. Bowel sounds are normal.     Palpations: Abdomen is soft.  Musculoskeletal:     Right lower leg: No edema.     Left lower leg: No edema.  Skin:    General: Skin is warm.  Neurological:     Mental Status: She is alert. Mental status is at baseline.  Psychiatric:        Mood and Affect: Mood normal.        Behavior: Behavior normal.          Assessment & Plan:   1. Annual physical exam (Primary) New patient, history reviewed and updated in the chart accordingly. 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  Plan for Pap smear as part of well woman check in a few weeks.  Per chart review, she is negative for hep B surface antibody, will administer vaccine today, flu given today. Tdap to be deferred to her next visit.  UTD on HPV vaccine per chart review. UTD on all other age-appropriate screenings. Counseled extensively about the importance of healthy diet and lifestyle given that she  is a newly breast-feeding mother.  Counseled to continue prenatal vitamins.  2. Anemia, unspecified type Per chart review, patient had extensive blood loss during childbirth, labs from 3 weeks ago consistent with acute blood loss anemia, patient has since discontinued iron supplements, will repeat CBC to monitor for return to baseline.  - CBC with Differential  3. Need for influenza vaccination - Flu vaccine  trivalent PF, 6mos and older(Flulaval,Afluria,Fluarix,Fluzone)   Return in about 8 weeks (around 12/20/2023) for Well woman check .   Garvey Westcott K Twanisha Foulk, MD  10/25/23

## 2023-11-14 ENCOUNTER — Ambulatory Visit (INDEPENDENT_AMBULATORY_CARE_PROVIDER_SITE_OTHER): Admitting: Certified Nurse Midwife

## 2023-11-14 ENCOUNTER — Other Ambulatory Visit (HOSPITAL_COMMUNITY)
Admission: RE | Admit: 2023-11-14 | Discharge: 2023-11-14 | Disposition: A | Source: Ambulatory Visit | Attending: Certified Nurse Midwife | Admitting: Certified Nurse Midwife

## 2023-11-14 DIAGNOSIS — R4589 Other symptoms and signs involving emotional state: Secondary | ICD-10-CM

## 2023-11-14 DIAGNOSIS — Z124 Encounter for screening for malignant neoplasm of cervix: Secondary | ICD-10-CM | POA: Insufficient documentation

## 2023-11-14 DIAGNOSIS — Z3009 Encounter for other general counseling and advice on contraception: Secondary | ICD-10-CM

## 2023-11-14 NOTE — Progress Notes (Signed)
 Post Partum Visit Note  Norma CUPPLES is a 23 y.o. [id number] female who presents for a postpartum visit. She is 6 weeks postpartum following a normal spontaneous vaginal delivery.  I have fully reviewed the prenatal and intrapartum course. The delivery was at 39 gestational weeks.  Anesthesia: epidural. Postpartum course has been well. Baby is doing well. Baby is feeding by breast. Bleeding no bleeding. Bowel function is normal. Bladder function is normal. Patient is not sexually active. Contraception method is unsure. Postpartum depression screening: positive.   The pregnancy intention screening data noted above was reviewed. Potential methods of contraception were discussed. The patient elected to proceed with No data recorded.   Edinburgh Postnatal Depression Scale - 11/14/23 1023       Edinburgh Postnatal Depression Scale:  In the Past 7 Days   I have been able to laugh and see the funny side of things. 1    I have looked forward with enjoyment to things. 0    I have blamed myself unnecessarily when things went wrong. 2    I have been anxious or worried for no good reason. 2    I have felt scared or panicky for no good reason. 2    Things have been getting on top of me. 2    I have been so unhappy that I have had difficulty sleeping. 0    I have felt sad or miserable. 3    I have been so unhappy that I have been crying. 2    The thought of harming myself has occurred to me. 0    Edinburgh Postnatal Depression Scale Total 14          Health Maintenance Due  Topic Date Due   DTaP/Tdap/Td (5 - Tdap) 10/20/2013   Meningococcal B Vaccine (1 of 2 - Standard) Never done   COVID-19 Vaccine (1 - 2024-25 season) Never done    The following portions of the patient's history were reviewed and updated as appropriate: allergies, current medications, past family history, past medical history, past social history, past surgical history, and problem list.  Review of Systems A  comprehensive review of systems was negative except for: Behavioral/Psych: positive for depression and sleep disturbance  Objective:  BP 126/79   Pulse 76   Ht 5' 4 (1.626 m)   Wt 190 lb (86.2 kg)   LMP 12/26/2022 (Exact Date)   Breastfeeding Yes   BMI 32.61 kg/m    General:  alert, cooperative, and appears stated age   Breasts:  Actively breast feeding; exam deferred   Lungs: clear to auscultation bilaterally  Heart:  regular rate and rhythm, S1, S2 normal, no murmur, click, rub or gallop  Abdomen: soft, non-tender; bowel sounds normal; no masses,  no organomegaly   GU exam:  normal. Previous tears well healed. Normal Vaginal discharge and cervical mucous visualized. Pap Smear collected today. Exam completed in the Presence of a chaperone. Harlene Gander CMA        Assessment:    1. Postpartum care and examination - Normal Postpartum exam.  - Patient expressed some sadness outside of normal baby blues . Long discussion about new role of motherhood, expectations versus worsening signs and symptoms. Patient has a psych appt for next week.  - Encouraged support groups, counseling and healthy lifestyle choices.   2. Birth control counseling - Reviewed all options for birth control.  - R/B/A all reviewed - Patient states that she would like to think about  it.Recommended condom use in the interim.     Normal postpartum exam.   Plan:   Essential components of care per ACOG recommendations:  1.  Mood and well being: Patient with positive depression screening today. Reviewed local resources for support.  - Patient tobacco use? No.   - hx of drug use? No.    2. Infant care and feeding:  -Patient currently breastmilk feeding? Yes. Reviewed importance of draining breast regularly to support lactation.  -Social determinants of health (SDOH) reviewed in EPIC. No concerns.   3. Sexuality, contraception and birth spacing - Patient does not want a pregnancy in the next year.   Desired family size is 1 children.  - Reviewed reproductive life planning. Reviewed contraceptive methods based on pt preferences and effectiveness.  Patient desired No Method - No Contraceptive Precautions today.   - Discussed birth spacing of 18 months  4. Sleep and fatigue -Encouraged family/partner/community support of 4 hrs of uninterrupted sleep to help with mood and fatigue  5. Physical Recovery  - Discussed patients delivery and complications. She describes her labor as good. - Patient had a Vaginal, no problems at delivery. Patient had Bilateral deep sulcal tears, second degree perineal laceration, left labial laceration   laceration. All very well healed Perineal healing reviewed. Patient expressed understanding - Patient has urinary incontinence? No. - Patient is safe to resume physical and sexual activity at her desired opportunity.   6.  Health Maintenance - HM due items addressed Yes - Last pap smear  Diagnosis  Date Value Ref Range Status  11/14/2023 - Low grade squamous intraepithelial lesion (LSIL) (A)  Final   Pap smear done at today's visit.  -Breast Cancer screening indicated? No.   7. Chronic Disease/Pregnancy Condition follow up: None  - PCP follow up, plan to schedule another visit for Contraception.   [name] Cedar, CNM [facility] [employer], [facility]

## 2023-11-17 ENCOUNTER — Ambulatory Visit: Payer: Self-pay | Admitting: Certified Nurse Midwife

## 2023-11-20 LAB — CYTOLOGY - PAP
Chlamydia: NEGATIVE
Comment: NEGATIVE
Comment: NEGATIVE
Comment: NORMAL
High risk HPV: POSITIVE — AB
Neisseria Gonorrhea: NEGATIVE

## 2023-11-23 ENCOUNTER — Other Ambulatory Visit: Payer: Self-pay | Admitting: Medical Genetics

## 2023-11-23 DIAGNOSIS — Z006 Encounter for examination for normal comparison and control in clinical research program: Secondary | ICD-10-CM

## 2023-12-04 ENCOUNTER — Other Ambulatory Visit: Payer: Self-pay

## 2023-12-04 ENCOUNTER — Encounter: Payer: Self-pay | Admitting: Physical Therapy

## 2023-12-04 ENCOUNTER — Ambulatory Visit: Attending: Certified Nurse Midwife | Admitting: Physical Therapy

## 2023-12-04 DIAGNOSIS — R279 Unspecified lack of coordination: Secondary | ICD-10-CM | POA: Insufficient documentation

## 2023-12-04 DIAGNOSIS — R293 Abnormal posture: Secondary | ICD-10-CM | POA: Insufficient documentation

## 2023-12-04 DIAGNOSIS — M6281 Muscle weakness (generalized): Secondary | ICD-10-CM | POA: Insufficient documentation

## 2023-12-04 NOTE — Therapy (Signed)
 OUTPATIENT PHYSICAL THERAPY FEMALE PELVIC EVALUATION   Patient Name: Norma Deleon MRN: [id number] DOB:December 21, 2000, 23 y.o., female Today's Date: 12/04/2023  END OF SESSION:  PT End of Session - 12/04/23 1316     Visit Number 1    Number of Visits 8    Date for Recertification  01/29/24    Authorization Type Medicaid [employer]    PT Start Time 1015    PT Stop Time 1100    PT Time Calculation (min) 45 min    Activity Tolerance Patient tolerated treatment well    Behavior During Therapy [facility] for tasks assessed/performed          History reviewed. No pertinent past medical history. Past Surgical History:  Procedure Laterality Date   NO PAST SURGERIES     Patient Active Problem List   Diagnosis Date Noted   Obesity 10/25/2023    PCP: Bennett Reuben POUR, MD  REFERRING PROVIDER: Regino Camie LABOR, CNM   REFERRING DIAG: O80 (ICD-10-CM) - SVD (spontaneous vaginal delivery)  THERAPY DIAG:  Muscle weakness (generalized) - Plan: PT plan of care cert/re-cert  Unspecified lack of coordination - Plan: PT plan of care cert/re-cert  Abnormal posture - Plan: PT plan of care cert/re-cert  Rationale for Evaluation and Treatment: Rehabilitation  ONSET DATE: 10/01/23  SUBJECTIVE:                                                                                                                                                                                           SUBJECTIVE STATEMENT: Patient reports to PFPT following her first vaginal delivery - she is having some pain postpartum, but not as much as initially PP. No urinary or bowel concerns to report. She had her 6 week postpartum check up and it went well, had a pap smear at this time also. She has returned to intercourse, and there was some bleeding initially after first attempt and she hasn't done it since.  Fluid intake: trying to increase water intake, she is breastfeeding   FUNCTIONAL LIMITATIONS: bending over, walking for  too long   PERTINENT HISTORY:  Medications for current condition: none Surgeries: none Other: none Sexual abuse: No  PAIN:  Are you having pain? Yes NPRS scale: 4-5/10 - groin region this morning  Pain location: Internal, External, Deep, Right, Anterior, and Posterior  Pain type: aching and sharp Pain description: intermittent   Aggravating factors: bending over to handle baby  Relieving factors: unknown   PRECAUTIONS: None  RED FLAGS: None   WEIGHT BEARING RESTRICTIONS: No  FALLS:  Has patient fallen in last 6 months? No  OCCUPATION: will return  to work - works part time - both sitting and standing   ACTIVITY LEVEL : walks at least 3 days per week   PLOF: Independent with basic ADLs  PATIENT GOALS: more felxibility, less pain, stronger in the core hips    BOWEL MOVEMENT: Pain with bowel movement: No Type of bowel movement:Type (Bristol Stool Scale) 4, Frequency within normal limits , Strain no , and Splinting no  Fully empty rectum: Yes:   Leakage: No                                                     Caused by: N/A Pads: No Fiber supplement/laxative No  URINATION: Pain with urination: No Fully empty bladder: Yes:                                  Post-void dribble: No Stream: Strong Urgency: Yes  Frequency:during the day within normal limits                                                          Nocturia: No   Leakage: none Pads/briefs: No  INTERCOURSE: is sexually active, but paused due to bleeding that happened initially after first attempt   Ability to have vaginal penetration Yes  Pain with intercourse: Initial Penetration Dryness: Yes  Marinoff Scale: 1/3 Lubricant: no   PREGNANCY: Vaginal deliveries 1 Tearing Yes: 2nd degree tearing, stitches healed up well  Episiotomy No C-section deliveries 0 Currently pregnant No  PROLAPSE: Pressure - feels this when sitting on the toilet   OBJECTIVE:  Note: Objective measures were completed at  Evaluation unless otherwise noted.  PATIENT SURVEYS:  PFIQ-7: 45  COGNITION: Overall cognitive status: Within functional limits for tasks assessed     SENSATION: Light touch: Appears intact  LUMBAR SPECIAL TESTS:  Single leg stance test: Positive  FUNCTIONAL TESTS:  Single leg stance:  Rt: +   Lt: +  Sit-up test: 1/3 Squat: within normal limits  Bed mobility: within functional limits   GAIT: Assistive device utilized: None Comments: mild trendelenburg gait pattern with ambulation   POSTURE: rounded shoulders and forward head  LUMBARAROM/PROM:  A/PROM A/PROM  Eval (% available)  Flexion 75  Extension 75  Right lateral flexion 100  Left lateral flexion 100  Right rotation 100  Left rotation 100   (Blank rows = not tested)  LOWER EXTREMITY ROM: within functional limits   Active ROM Right eval Left eval  Hip flexion    Hip extension    Hip abduction    Hip adduction    Hip internal rotation    Hip external rotation    Knee flexion    Knee extension    Ankle dorsiflexion    Ankle plantarflexion    Ankle inversion    Ankle eversion     (Blank rows = not tested)  LOWER EXTREMITY MMT: 4/5 bilateral knees and hips grossly   MMT Right eval Left eval  Hip flexion    Hip extension    Hip abduction    Hip adduction    Hip internal  rotation    Hip external rotation    Knee flexion    Knee extension    Ankle dorsiflexion    Ankle plantarflexion    Ankle inversion    Ankle eversion     (Blank rows = not tested) PALPATION:  General: no tenderness to palpation of bilateral adductors or hip flexors   Pelvic Alignment: within normal limits   Abdominal: abdominal bracing at rest  Diastasis: Yes: mild separation at umbilicus  Distortion: No  Breathing: apical breathing pattern with decreased lower rib excursion with deep inhalation  Scar tissue: No                External Perineal Exam: To be assessed at first follow up visit per pt request                               Internal Pelvic Floor: To be assessed at first follow up visit per pt request   Patient confirms identification and approves PT to assess internal pelvic floor and treatment Yes  PELVIC MMT: To be assessed at first follow up visit per pt request    MMT eval  Vaginal   Internal Anal Sphincter   External Anal Sphincter   Puborectalis   Diastasis Recti   (Blank rows = not tested)  TONE: To be assessed at first follow up visit per pt request   PROLAPSE: To be assessed at first follow up visit per pt request   TODAY'S TREATMENT:                                                                                                                              DATE:   EVAL 12/04/23: Examination completed, findings reviewed, pt educated on POC. Pt motivated to participate in PT and agreeable to attempt recommendations.  For all possible CPT codes, reference the Planned Interventions line above  Check all conditions that are expected to impact treatment: {Conditions expected to impact treatment:Current pregnancy or recent postpartum   If treatment provided at initial evaluation, no treatment charged due to lack of authorization.     PATIENT EDUCATION:  Education details: relative anatomy and the connection between the pelvic floor and diaphragm, water intake and bladder irritants  Person educated: Patient Education method: Explanation, Demonstration, Tactile cues, Verbal cues, and Handouts Education comprehension: verbalized understanding, returned demonstration, verbal cues required, tactile cues required, and needs further education  HOME EXERCISE PROGRAM: Access Code: [email or web address] URL: [email or web address] Date: 12/04/2023 Prepared by: Celena Domino  Exercises - Seated Pelvic Floor Contraction  - 1 x daily - 7 x weekly - 2 sets - 10 reps - Supine Butterfly Groin Stretch  - 1 x daily - 7 x weekly - 2 sets - hold - Child's Pose Stretch  - 1 x daily - 7  x weekly - 2 sets - hold - Supine Lower Trunk Rotation  -  1 x daily - 7 x weekly - 2 sets - 10 reps  Patient Education - Get To Know Your Pelvic Floor- Female  ASSESSMENT:  CLINICAL IMPRESSION: Patient is a 23 y.o. female  who was seen today for physical therapy evaluation and treatment for postpartum-related pain after giving birth to her first child in August. She is currently having 4-5/10 pain at rest in the groin/pelvic region that has slowly been improving since birth. She demonstrates lumbar range of motion that is within functional limits, mild hip weakness, and minimal abdominal separation at umbilicus. She wishes to wait until first follow up session for her internal examination as she has her newborn present with her today. She demonstrates a pelvic floor contraction that is uncoordinated when paired with diaphragmatic breathing, so we introduced a pelvic floor activation exercise in supine and stretching for the lumbopelvic region to promote tissue mobility, blood flow to pelvic floor musculature, and decreased pain at rest. Overall, pt tolerated session well and Pt would benefit from additional PT to further address deficits.    OBJECTIVE IMPAIRMENTS: decreased activity tolerance, decreased coordination, decreased endurance, decreased mobility, decreased ROM, decreased strength, and pain.   ACTIVITY LIMITATIONS: carrying, lifting, bending, sitting, standing, squatting, stairs, transfers, bed mobility, bathing, and dressing  PARTICIPATION LIMITATIONS: community activity and caring for newborn  PERSONAL FACTORS: Past/current experiences and Time since onset of injury/illness/exacerbation are also affecting patient's functional outcome.   REHAB POTENTIAL: Good  CLINICAL DECISION MAKING: Stable/uncomplicated  EVALUATION COMPLEXITY: Low   GOALS: Goals reviewed with patient? Yes  SHORT TERM GOALS: Target date: 01/01/2024  Pt will be independent with HEP.  Baseline: Goal  status: INITIAL  2.  Pt will be independent with diaphragmatic breathing and down training activities in order to improve pelvic floor relaxation. Baseline:  Goal status: INITIAL  3.  Pt will be independent with the knack, urge suppression technique, and double voiding in order to improve bladder habits and decrease urinary incontinence.  Baseline:  Goal status: INITIAL  4.  Pt will be independent with use of squatty potty, relaxed toileting mechanics, and improved bowel movement techniques in order to increase ease of bowel movements and complete evacuation.  Baseline:  Goal status: INITIAL  LONG TERM GOALS: Target date: 06/03/2024  Pt will be independent with advanced HEP.  Baseline:  Goal status: INITIAL  2.  Pt to demonstrate improved coordination of pelvic floor and breathing mechanics with 10# squat with appropriate synergistic patterns to decrease pain at least 75% of the time for improved ability to complete a 30 minute workout with strain at pelvic floor and symptoms.   Baseline:  Goal status: INITIAL  3.  Pt will be able to correctly perform diaphragmatic breathing and appropriate pressure management in order to prevent worsening vaginal wall laxity and improve pelvic floor A/ROM.  Baseline:  Goal status: INITIAL  4.  Patient will report 75% improved pain to allow her to bend forward and care for her newborn without pain being a limiting factor.  Baseline:  Goal status: INITIAL  PLAN:  PT FREQUENCY: 1-2x/week  PT DURATION: 6 months  PLANNED INTERVENTIONS: 97110-Therapeutic exercises, 97530- Therapeutic activity, 97112- Neuromuscular re-education, 97535- Self Care, [id number]- Manual therapy, Patient/Family education, Taping, Joint mobilization, Spinal mobilization, Scar mobilization, Cryotherapy, Moist heat, and Biofeedback  PLAN FOR NEXT SESSION: internal vaginal exam if pt is comfortable, introduce pelvic floor training and core/gluteal strengthening, downtraining  stretching, toileting mechanics if needed   Celena JAYSON Domino, PT 12/04/2023, 1:17 PM

## 2023-12-11 ENCOUNTER — Ambulatory Visit: Admitting: Physical Therapy

## 2023-12-11 DIAGNOSIS — R279 Unspecified lack of coordination: Secondary | ICD-10-CM

## 2023-12-11 DIAGNOSIS — M6281 Muscle weakness (generalized): Secondary | ICD-10-CM | POA: Diagnosis not present

## 2023-12-11 DIAGNOSIS — R293 Abnormal posture: Secondary | ICD-10-CM

## 2023-12-11 NOTE — Therapy (Signed)
 OUTPATIENT PHYSICAL THERAPY FEMALE PELVIC TREATMENT   Patient Name: Norma Deleon MRN: [id number] DOB:11-30-2000, 23 y.o., female Today's Date: 12/11/2023  END OF SESSION:  PT End of Session - 12/11/23 1138     Visit Number 2    Number of Visits 8    Date for Recertification  01/29/24    Authorization Type Medicaid [employer]    Authorization Time Period [employer] Approved 6 vl 12/04/23-02/01/24-auth#[id number]    Authorization - Visit Number 2    Authorization - Number of Visits 6    PT Start Time 1100    PT Stop Time 1140    PT Time Calculation (min) 40 min    Activity Tolerance Patient tolerated treatment well    Behavior During Therapy WFL for tasks assessed/performed           No past medical history on file. Past Surgical History:  Procedure Laterality Date   NO PAST SURGERIES     Patient Active Problem List   Diagnosis Date Noted   Obesity 10/25/2023    PCP: Bennett Reuben POUR, MD  REFERRING PROVIDER: Regino Camie LABOR, CNM   REFERRING DIAG: O80 (ICD-10-CM) - SVD (spontaneous vaginal delivery)  THERAPY DIAG:  Muscle weakness (generalized)  Unspecified lack of coordination  Abnormal posture  Rationale for Evaluation and Treatment: Rehabilitation  ONSET DATE: 10/01/23  SUBJECTIVE:                                                                                                                                                                                           SUBJECTIVE STATEMENT: Still feeling a little achy but trying to do stretches. No questions after first session. No urinary or bowel concerns to report. Legs and hips have been feeling a little sore recently. Pt started her menstrual cycle and does not wish to have an internal examination performed at this time.   Eval: Patient reports to PFPT following her first vaginal delivery - she is having some pain postpartum, but not as much as initially PP. No urinary or bowel concerns to report. She had  her 6 week postpartum check up and it went well, had a pap smear at this time also. She has returned to intercourse, and there was some bleeding initially after first attempt and she hasn't done it since.  Fluid intake: trying to increase water intake, she is breastfeeding   FUNCTIONAL LIMITATIONS: bending over, walking for too long   PERTINENT HISTORY:  Medications for current condition: none Surgeries: none Other: none Sexual abuse: No  PAIN:  Are you having pain? Yes NPRS scale: 4-5/10 - groin region this morning  Pain location: Internal, External, Deep, Right, Anterior, and Posterior  Pain type: aching and sharp Pain description: intermittent   Aggravating factors: bending over to handle baby  Relieving factors: unknown   PRECAUTIONS: None  RED FLAGS: None   WEIGHT BEARING RESTRICTIONS: No  FALLS:  Has patient fallen in last 6 months? No  OCCUPATION: will return to work - works part time - both sitting and standing   ACTIVITY LEVEL : walks at least 3 days per week   PLOF: Independent with basic ADLs  PATIENT GOALS: more felxibility, less pain, stronger in the core hips    BOWEL MOVEMENT: Pain with bowel movement: No Type of bowel movement:Type (Bristol Stool Scale) 4, Frequency within normal limits , Strain no , and Splinting no  Fully empty rectum: Yes:   Leakage: No                                                     Caused by: N/A Pads: No Fiber supplement/laxative No  URINATION: Pain with urination: No Fully empty bladder: Yes:                                  Post-void dribble: No Stream: Strong Urgency: Yes  Frequency:during the day within normal limits                                                          Nocturia: No   Leakage: none Pads/briefs: No  INTERCOURSE: is sexually active, but paused due to bleeding that happened initially after first attempt   Ability to have vaginal penetration Yes  Pain with intercourse: Initial  Penetration Dryness: Yes  Marinoff Scale: 1/3 Lubricant: no   PREGNANCY: Vaginal deliveries 1 Tearing Yes: 2nd degree tearing, stitches healed up well  Episiotomy No C-section deliveries 0 Currently pregnant No  PROLAPSE: Pressure - feels this when sitting on the toilet   OBJECTIVE:  Note: Objective measures were completed at Evaluation unless otherwise noted.  PATIENT SURVEYS:  PFIQ-7: 45  COGNITION: Overall cognitive status: Within functional limits for tasks assessed     SENSATION: Light touch: Appears intact  LUMBAR SPECIAL TESTS:  Single leg stance test: Positive  FUNCTIONAL TESTS:  Single leg stance:  Rt: +   Lt: +  Sit-up test: 1/3 Squat: within normal limits  Bed mobility: within functional limits   GAIT: Assistive device utilized: None Comments: mild trendelenburg gait pattern with ambulation   POSTURE: rounded shoulders and forward head  LUMBARAROM/PROM:  A/PROM A/PROM  Eval (% available)  Flexion 75  Extension 75  Right lateral flexion 100  Left lateral flexion 100  Right rotation 100  Left rotation 100   (Blank rows = not tested)  LOWER EXTREMITY ROM: within functional limits   Active ROM Right eval Left eval  Hip flexion    Hip extension    Hip abduction    Hip adduction    Hip internal rotation    Hip external rotation    Knee flexion    Knee extension    Ankle dorsiflexion    Ankle  plantarflexion    Ankle inversion    Ankle eversion     (Blank rows = not tested)  LOWER EXTREMITY MMT: 4/5 bilateral knees and hips grossly   MMT Right eval Left eval  Hip flexion    Hip extension    Hip abduction    Hip adduction    Hip internal rotation    Hip external rotation    Knee flexion    Knee extension    Ankle dorsiflexion    Ankle plantarflexion    Ankle inversion    Ankle eversion     (Blank rows = not tested) PALPATION:  General: no tenderness to palpation of bilateral adductors or hip flexors   Pelvic Alignment:  within normal limits   Abdominal: abdominal bracing at rest  Diastasis: Yes: mild separation at umbilicus  Distortion: No  Breathing: apical breathing pattern with decreased lower rib excursion with deep inhalation  Scar tissue: No                External Perineal Exam: To be assessed at first follow up visit per pt request                              Internal Pelvic Floor: To be assessed at first follow up visit per pt request   Patient confirms identification and approves PT to assess internal pelvic floor and treatment Yes  PELVIC MMT: To be assessed at first follow up visit per pt request    MMT eval  Vaginal   Internal Anal Sphincter   External Anal Sphincter   Puborectalis   Diastasis Recti   (Blank rows = not tested)  TONE: To be assessed at first follow up visit per pt request   PROLAPSE: To be assessed at first follow up visit per pt request   TODAY'S TREATMENT:                                                                                                                              DATE:   EVAL 12/04/23: Examination completed, findings reviewed, pt educated on POC. Pt motivated to participate in PT and agreeable to attempt recommendations.  For all possible CPT codes, reference the Planned Interventions line above  Check all conditions that are expected to impact treatment: {Conditions expected to impact treatment:Current pregnancy or recent postpartum   If treatment provided at initial evaluation, no treatment charged due to lack of authorization.    12/11/23: NuStep level 1 - 6 minutes - PT present to discuss current status  Sidelying diaphragmatic breathing + pelvic floor lengthening with inhalation 2x85min   Supine Shaniqua wings with half foam roll under thoracic spine to promote chest stretching and postural support 2x10  Supine single knee to chest stretch + diaphragmatic breathing 2x50min  Lower trunk rotations + diaphragmatic breathing 2x23min  Cat/cow +  diaphragmatic breathing 2x10  Seated wall angels +  diaphragmatic breathing 2x10  Supine butterfly stretch + diaphragmatic breathing 2x33min  Pigeon pose + diaphragmatic breathing 2x37min   PATIENT EDUCATION:  Education details: relative anatomy and the connection between the pelvic floor and diaphragm, water intake and bladder irritants  Person educated: Patient Education method: Explanation, Demonstration, Tactile cues, Verbal cues, and Handouts Education comprehension: verbalized understanding, returned demonstration, verbal cues required, tactile cues required, and needs further education  HOME EXERCISE PROGRAM: Access Code: [email or web address] URL: [email or web address] Date: 12/11/2023 Prepared by: Celena Domino  Exercises - Seated Pelvic Floor Contraction  - 1 x daily - 7 x weekly - 2 sets - 10 reps - Supine Butterfly Groin Stretch  - 1 x daily - 7 x weekly - 2 sets - hold - Child's Pose Stretch  - 1 x daily - 7 x weekly - 2 sets - hold - Supine Lower Trunk Rotation  - 1 x daily - 7 x weekly - 2 sets - 10 reps - Seated Combined Hip Internal and External Rotation AROM: Hip Switch  - 1 x daily - 7 x weekly - 2 sets - 10 reps - Sidelying Diaphragmatic Breathing  - 1 x daily - 7 x weekly - 1 sets - 3-5 reps - Supine Thoracic Mobilization Foam Roll Horizontal with Arm Stretch  - 1 x daily - 7 x weekly - 1 sets - 3-5 reps - 30-45s holds - Supine Single Knee to Chest Stretch  - 1 x daily - 7 x weekly - 1 sets - 3-5 reps - 30-45s holds - Cat Cow  - 1 x daily - 7 x weekly - 1 sets - 3-5 reps - 30-45s holds - Sitting Wall Angels  - 1 x daily - 7 x weekly - 1 sets - 3-5 reps - 30-45s holds - Pigeon Pose  - 1 x daily - 7 x weekly - 2 sets - hold  ASSESSMENT:  CLINICAL IMPRESSION: Patient is a 23 y.o. female  who was seen today for physical therapy evaluation and treatment for postpartum-related pain after giving birth to her first child in August. Her right side has been  feeling tighter in general compared to her left, in the low back, hips, groin, and inner thighs. Today we continued postpartum rehab focusing on lumbopelvic stretching, mind to muscle connection between core and pelvic floor, and blood flow to lumbar spine/core. No pain at end of session, more of a muscle burn. Overall, pt tolerated session well and Pt would benefit from additional PT to further address deficits.    OBJECTIVE IMPAIRMENTS: decreased activity tolerance, decreased coordination, decreased endurance, decreased mobility, decreased ROM, decreased strength, and pain.   ACTIVITY LIMITATIONS: carrying, lifting, bending, sitting, standing, squatting, stairs, transfers, bed mobility, bathing, and dressing  PARTICIPATION LIMITATIONS: community activity and caring for newborn  PERSONAL FACTORS: Past/current experiences and Time since onset of injury/illness/exacerbation are also affecting patient's functional outcome.   REHAB POTENTIAL: Good  CLINICAL DECISION MAKING: Stable/uncomplicated  EVALUATION COMPLEXITY: Low   GOALS: Goals reviewed with patient? Yes  SHORT TERM GOALS: Target date: 01/01/2024  Pt will be independent with HEP.  Baseline: Goal status: INITIAL  2.  Pt will be independent with diaphragmatic breathing and down training activities in order to improve pelvic floor relaxation. Baseline:  Goal status: INITIAL  3.  Pt will be independent with the knack, urge suppression technique, and double voiding in order to improve bladder habits and decrease urinary incontinence.  Baseline:  Goal status: INITIAL  4.  Pt  will be independent with use of squatty potty, relaxed toileting mechanics, and improved bowel movement techniques in order to increase ease of bowel movements and complete evacuation.  Baseline:  Goal status: INITIAL  LONG TERM GOALS: Target date: 06/03/2024  Pt will be independent with advanced HEP.  Baseline:  Goal status: INITIAL  2.  Pt to  demonstrate improved coordination of pelvic floor and breathing mechanics with 10# squat with appropriate synergistic patterns to decrease pain at least 75% of the time for improved ability to complete a 30 minute workout with strain at pelvic floor and symptoms.   Baseline:  Goal status: INITIAL  3.  Pt will be able to correctly perform diaphragmatic breathing and appropriate pressure management in order to prevent worsening vaginal wall laxity and improve pelvic floor A/ROM.  Baseline:  Goal status: INITIAL  4.  Patient will report 75% improved pain to allow her to bend forward and care for her newborn without pain being a limiting factor.  Baseline:  Goal status: INITIAL  PLAN:  PT FREQUENCY: 1-2x/week  PT DURATION: 6 months  PLANNED INTERVENTIONS: 97110-Therapeutic exercises, 97530- Therapeutic activity, 97112- Neuromuscular re-education, 97535- Self Care, [id number]- Manual therapy, Patient/Family education, Taping, Joint mobilization, Spinal mobilization, Scar mobilization, Cryotherapy, Moist heat, and Biofeedback  PLAN FOR NEXT SESSION: internal vaginal exam if pt is comfortable, introduce pelvic floor training and core/gluteal strengthening, downtraining stretching, toileting mechanics if needed   Celena JAYSON Domino, PT 12/11/2023, 11:40 AM

## 2023-12-20 ENCOUNTER — Ambulatory Visit

## 2023-12-20 VITALS — BP 106/70 | HR 86 | Temp 99.2°F | Ht 64.25 in | Wt 190.0 lb

## 2023-12-20 DIAGNOSIS — D649 Anemia, unspecified: Secondary | ICD-10-CM | POA: Diagnosis not present

## 2023-12-20 NOTE — Patient Instructions (Addendum)
 Thank you for visiting  Healthcare today! Here's what we talked about: - Pick up prenatal vitamins - Come back for labs

## 2023-12-20 NOTE — Progress Notes (Signed)
 Subjective:   This visit was conducted in person. The patient gave informed consent to the use of Abridge AI technology to record the contents of the encounter as documented below.   Patient ID: Norma Deleon Minor, female    DOB: Jul 05, 2000, 23 y.o.   MRN: [id number]   Discussed the use of AI scribe software for clinical note transcription with the patient, who gave verbal consent to proceed.  History of Present Illness Norma Deleon is a 23 year old female who presents for a well woman check and discussion of anemia.  She recently had a postpartum visit where a Pap smear showed abnormal results, with a recommendation to repeat it in one year. During this visit, a breast exam was not performed. She is currently breastfeeding and managing well with an adequate milk supply.  Her recent labs showed a hemoglobin level of 11.6 g/dL. She is not currently taking prenatal vitamins as she has not purchased them yet. She has not been on oral iron supplements since her hospital discharge, where she took them three times. She is trying to increase her intake of iron-rich foods such as green vegetables.  Her mood has been improving, and she feels she is 'starting to get back to my normal self.' She has good support with her baby and is managing to get some sleep, not feeling significantly more tired than usual.  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 She has good family support, which allows her to work despite having a new baby. She is trying to stay hydrated while breastfeeding and is both breastfeeding and pumping.      Review of Systems      No Known Allergies  Current Outpatient Medications on File Prior to Visit  Medication Sig Dispense Refill   acetaminophen  (TYLENOL ) 500 MG tablet Take 2 tablets (1,000 mg total) by mouth every 8 (eight) hours as needed for mild pain (pain score 1-3). 100 tablet 1   coconut oil OIL Apply 1  Application topically as needed.     ibuprofen  (ADVIL ) 800 MG tablet Take 1 tablet (800 mg total) by mouth every 8 (eight) hours. 30 tablet 0   No current facility-administered medications on file prior to visit.    BP 106/70 (BP Location: Left Arm, Patient Position: Sitting, Cuff Size: Large)   Pulse 86   Temp 99.2 F (37.3 C) (Oral)   Ht 5' 4.25 (1.632 m)   Wt 190 lb (86.2 kg)   SpO2 97%   Breastfeeding Yes   BMI 32.36 kg/m   Objective:      Physical Exam GENERAL: Alert, cooperative, well developed, no acute distress. HEAD: Normocephalic atraumatic. EYES: Extraocular movements intact bilaterally, no subconjunctival pallor BL EXTREMITIES: No cyanosis or edema, no nailbed pallor. NEUROLOGICAL: Oriented to person, place and time, no gait abnormalities, moves all extremities without gross motor or sensory deficit.       Assessment & Plan:   Assessment & Plan Anemia Reviewed lab results with patient, currently asymptomatic, clinical exam is benign, mild anemia with hemoglobin at 11.6 g/dL. Possible iron deficiency to be confirmed with lab tests.  - Ordered fasting iron level test in one week. - Advised increased intake of iron-rich foods such as broccoli and spinach. - Instructed to resume daily prenatal vitamins. - If iron level is low, consider oral iron supplementation three times a week.. - Scheduled follow-up physical exam in one year. -  Advised to maintain hydration and adequate nutrition while breastfeeding.    Return in about 1 year (around 12/19/2024) for CPE, fasting lab visit 1 week from now.   Maclovia Uher K Bert Givans, MD  12/20/23     Contains text generated by Abridge.

## 2023-12-27 ENCOUNTER — Other Ambulatory Visit (INDEPENDENT_AMBULATORY_CARE_PROVIDER_SITE_OTHER)

## 2023-12-27 DIAGNOSIS — D649 Anemia, unspecified: Secondary | ICD-10-CM | POA: Diagnosis not present

## 2023-12-27 LAB — IBC + FERRITIN
Ferritin: 5.4 ng/mL — ABNORMAL LOW (ref 10.0–291.0)
Iron: 73 ug/dL (ref 42–145)
Saturation Ratios: 14.9 % — ABNORMAL LOW (ref 20.0–50.0)
TIBC: 488.6 ug/dL — ABNORMAL HIGH (ref 250.0–450.0)
Transferrin: 349 mg/dL (ref 212.0–360.0)

## 2023-12-28 ENCOUNTER — Ambulatory Visit: Payer: Self-pay

## 2023-12-28 MED ORDER — IRON (FERROUS SULFATE) 325 (65 FE) MG PO TABS: 325.0000 mg | ORAL_TABLET | ORAL | 0 refills | Status: DC

## 2024-01-01 ENCOUNTER — Ambulatory Visit: Admitting: Physical Therapy

## 2024-01-17 ENCOUNTER — Ambulatory Visit: Payer: Self-pay

## 2024-01-22 ENCOUNTER — Ambulatory Visit

## 2024-01-29 ENCOUNTER — Ambulatory Visit

## 2024-02-05 ENCOUNTER — Ambulatory Visit

## 2024-03-19 ENCOUNTER — Other Ambulatory Visit: Payer: Self-pay

## 2024-03-19 ENCOUNTER — Encounter: Payer: Self-pay | Admitting: Obstetrics

## 2024-03-19 ENCOUNTER — Ambulatory Visit: Payer: Self-pay | Admitting: Obstetrics

## 2024-03-19 VITALS — BP 108/74 | HR 89 | Ht 64.0 in | Wt 193.0 lb

## 2024-03-19 DIAGNOSIS — Z975 Presence of (intrauterine) contraceptive device: Secondary | ICD-10-CM | POA: Insufficient documentation

## 2024-03-19 DIAGNOSIS — Z30430 Encounter for insertion of intrauterine contraceptive device: Secondary | ICD-10-CM

## 2024-03-19 LAB — POCT URINE PREGNANCY: Preg Test, Ur: NEGATIVE

## 2024-03-19 MED ORDER — LEVONORGESTREL 20 MCG/DAY IU IUD
1.0000 | INTRAUTERINE_SYSTEM | Freq: Once | INTRAUTERINE | Status: AC
  Administered 2024-03-19: 1 via INTRAUTERINE

## 2024-03-19 NOTE — Patient Instructions (Signed)
 Intrauterine Device (IUD) Insertion: What to Expect  An intrauterine device (IUD) is put in (inserted) your uterus to prevent pregnancy. It's a small, T-shaped device that has one or two nylon strings hanging down from it. The strings hang out of your cervix, which is the lowest part of your uterus. Tell a health care provider about: Any allergies you have. All medicines you take. These include vitamins, herbs, eye drops, and creams. Any surgeries you have had. Any medical problems you have. Whether you're pregnant or may be pregnant. What are the risks? Your health care provider will talk with you about risks. These may include: Infection. Bleeding. Allergic reactions to medicines. A cut to the uterus, also called perforation, or damage to other structures or organs. Accidental placement of the IUD either in the muscle layer of the uterus or outside the uterus. The IUD falling out of the uterus. This is more common if you recently had a baby. Higher risk of ectopic pregnancy. This is when an egg is fertilized outside your uterus. This is rare. Pelvic inflammatory disease (PID). This is an infection in the uterus and fallopian tubes. The IUD doesn't cause the infection. The infection is usually from a sexually transmitted infection (STI). If this happens, it is usually during the first 20 days after the IUD is put in. This is rare. What happens before? Ask about changing or stopping: Any medicines you take. Any vitamins, herbs, or supplements you take. Your provider may tell you to take pain medicines you can buy at the store before the procedure. You may have tests for: Pregnancy. You may have a pee (urine) or blood sample taken. STIs. Placing an IUD can make an infection worse. To check for cervical cancer. You may have a Pap test, which is when cells from your cervix are removed for testing. What happens during an IUD insertion? A tool, called a speculum, will be placed in your  vagina and widened so that your provider can see your cervix. A medicine to clean your cervix may be used to help lower your risk of infection. You may be given medicine to numb your cervix. This medicine is usually given by an injection into your cervix. A tool will be put into your uterus to check the length of your uterus. A thin tube that holds the IUD will be put into your vagina, through the opening of your cervix, and into your uterus. The IUD will be placed in your uterus. The tube that holds the IUD will be removed. The strings that are attached to the IUD will be trimmed so that they sit just outside your cervix. The speculum will be removed. These steps may vary. Ask what you can expect. What happens after? You may have: Bleeding. It can vary from light bleeding or spotting for a few days to period-like bleeding. This is normal. Cramps and pain in your belly. Dizziness or light-headedness. Pain in your lower back. Headaches and the feeling like you may throw up Follow these instructions at home: Do not have sex or put anything into your vagina for 24 hours after the IUD is placed. Before having sex, check to make sure that you can feel the IUD string or strings. You should be able to feel the end of the string below the opening of your cervix. If your IUD string is in place, you may continue with sex. If you had a hormonal IUD put in more than 7 days after your most recent period  started, you will need to use a backup method of birth control for 7 days after the IUD was placed. Hormones are chemicals that affect how the body works. Check that the IUD is still in place by feeling for the strings after every period, or check once a month. Use a condom every time you have sex to prevent STIs. An IUD won't protect you from STIs. Take your medicines only as told. Contact a health care provider if: You have any of the following problems with your IUD string or strings: The string  bothers or hurts you or your sexual partner. You can't feel the string. The string has gotten longer. The IUD comes out or you can feel the IUD in your vagina. You think you may be pregnant, or you miss your period. You think you may have an STI. You have bad-smelling discharge from your vagina. You have a fever and chills. You have pain during sex. Get help right away if: You have heavy bleeding, which means soaking more than 2 pads per hour for 2 hours in a row. You have sudden, really bad belly pain. This information is not intended to replace advice given to you by your health care provider. Make sure you discuss any questions you have with your health care provider. Document Revised: 10/09/2022 Document Reviewed: 10/09/2022 Elsevier Patient Education  2024 [employer].

## 2024-03-19 NOTE — Progress Notes (Signed)
" ° °  ENCOUNTER FOR IUD INSERTION   Subjective  LINDSAY STRAKA is a 24 y.o. G1P1001 who presents today for IUD insertion. She desires reversible long-term contraception. We have thoroughly reviewed the risks, benefits, and alternatives, and she has elected to proceed with Mirena  insertion.   Objective BP 108/74   Pulse 89   Ht 5' 4 (1.626 m)   Wt 193 lb (87.5 kg)   LMP 03/09/2024   Breastfeeding Yes   BMI 33.13 kg/m   UPT: negative  Pelvic exam: normal external genitalia, vulva, vagina, cervix, uterus and adnexa.   Procedure Note Consent was obtained prior to the procedure. A bimanual exam was performed to determine the position of the uterus. A sterile speculum was placed in the vagina, and the cervix was visualized. Betadine was applied to the cervix followed by 4% lidocaine . An Theresia was placed on the anterior lip of the cervix, and gentle traction was applied to straighten and stabilize it. The uterus was sounded to about 7 cm. The IUD was inserted to the appropriate depth and the insertion tool was removed. The strings were trimmed to about 3 cm. Bleeding was minimal. Delia tolerated the procedure well. Post-procedure care and warning signs were reviewed with [facility].  Follow up for annual visit or PRN.  Cieara Stierwalt, CNM "

## 2024-12-22 ENCOUNTER — Encounter
